# Patient Record
Sex: Male | Born: 1975 | Race: Asian | Hispanic: No | Marital: Married | Smoking: Never smoker
Health system: Southern US, Community
[De-identification: ages and names within clinical notes are randomized; demographics above are authoritative.]

## PROBLEM LIST (undated history)

## (undated) DIAGNOSIS — K219 Gastro-esophageal reflux disease without esophagitis: Secondary | ICD-10-CM

## (undated) HISTORY — PX: TONSILLECTOMY: SUR1361

## (undated) HISTORY — PX: APPENDECTOMY: SHX54

---

## 2003-11-19 ENCOUNTER — Emergency Department (HOSPITAL_COMMUNITY): Admission: EM | Admit: 2003-11-19 | Discharge: 2003-11-19 | Payer: Self-pay | Admitting: Emergency Medicine

## 2003-12-10 ENCOUNTER — Emergency Department (HOSPITAL_COMMUNITY): Admission: EM | Admit: 2003-12-10 | Discharge: 2003-12-10 | Payer: Self-pay | Admitting: Emergency Medicine

## 2011-01-23 ENCOUNTER — Emergency Department (HOSPITAL_COMMUNITY)
Admission: EM | Admit: 2011-01-23 | Discharge: 2011-01-23 | Payer: Self-pay | Source: Home / Self Care | Admitting: Emergency Medicine

## 2011-01-23 LAB — HEPATIC FUNCTION PANEL
ALT: 36 U/L (ref 0–53)
AST: 27 U/L (ref 0–37)
Albumin: 4.1 g/dL (ref 3.5–5.2)
Alkaline Phosphatase: 52 U/L (ref 39–117)
Bilirubin, Direct: <0.1 mg/dL (ref 0.0–0.3)
Total Bilirubin: 0.5 mg/dL (ref 0.3–1.2)
Total Protein: 7.5 g/dL (ref 6.0–8.3)

## 2011-01-23 LAB — CBC
HCT: 43.2 % (ref 39.0–52.0)
Hemoglobin: 14.6 g/dL (ref 13.0–17.0)
MCH: 30.4 pg (ref 26.0–34.0)
MCHC: 33.8 g/dL (ref 30.0–36.0)
MCV: 90 fL (ref 78.0–100.0)
Platelets: 334 10*3/uL (ref 150–400)
RBC: 4.8 MIL/uL (ref 4.22–5.81)
RDW: 12.9 % (ref 11.5–15.5)
WBC: 9.1 10*3/uL (ref 4.0–10.5)

## 2011-01-23 LAB — URINALYSIS, ROUTINE W REFLEX MICROSCOPIC
Bilirubin Urine: NEGATIVE
Hgb urine dipstick: NEGATIVE
Ketones, ur: NEGATIVE mg/dL
Nitrite: NEGATIVE
Protein, ur: NEGATIVE mg/dL
Specific Gravity, Urine: 1.044 — ABNORMAL HIGH (ref 1.005–1.030)
Urine Glucose, Fasting: NEGATIVE mg/dL
Urobilinogen, UA: 0.2 mg/dL (ref 0.0–1.0)
pH: 5.5 (ref 5.0–8.0)

## 2011-01-23 LAB — DIFFERENTIAL
Basophils Absolute: 0 10*3/uL (ref 0.0–0.1)
Basophils Relative: 0 % (ref 0–1)
Eosinophils Absolute: 0.5 10*3/uL (ref 0.0–0.7)
Eosinophils Relative: 5 % (ref 0–5)
Lymphocytes Relative: 26 % (ref 12–46)
Lymphs Abs: 2.4 10*3/uL (ref 0.7–4.0)
Monocytes Absolute: 1 10*3/uL (ref 0.1–1.0)
Monocytes Relative: 11 % (ref 3–12)
Neutro Abs: 5.2 10*3/uL (ref 1.7–7.7)
Neutrophils Relative %: 57 % (ref 43–77)

## 2011-01-23 LAB — BASIC METABOLIC PANEL
BUN: 15 mg/dL (ref 6–23)
CO2: 26 mEq/L (ref 19–32)
Calcium: 9.3 mg/dL (ref 8.4–10.5)
Chloride: 105 mEq/L (ref 96–112)
Creatinine, Ser: 0.94 mg/dL (ref 0.4–1.5)
GFR calc Af Amer: >60 mL/min (ref >60)
GFR calc non Af Amer: >60 mL/min (ref >60)
Glucose, Bld: 134 mg/dL — ABNORMAL HIGH (ref 70–99)
Potassium: 3.7 mEq/L (ref 3.5–5.1)
Sodium: 138 mEq/L (ref 135–145)

## 2011-01-23 LAB — LIPASE, BLOOD: Lipase: 19 U/L (ref 11–59)

## 2011-08-27 ENCOUNTER — Inpatient Hospital Stay (INDEPENDENT_AMBULATORY_CARE_PROVIDER_SITE_OTHER)
Admission: RE | Admit: 2011-08-27 | Discharge: 2011-08-27 | Disposition: A | Discharge status: Home / Self Care | Payer: 59 | Source: Ambulatory Visit | Attending: Family Medicine | Admitting: Family Medicine

## 2011-08-27 DIAGNOSIS — M549 Dorsalgia, unspecified: Secondary | ICD-10-CM

## 2013-07-24 ENCOUNTER — Emergency Department (HOSPITAL_COMMUNITY): Payer: Managed Care, Other (non HMO)

## 2013-07-24 ENCOUNTER — Emergency Department (INDEPENDENT_AMBULATORY_CARE_PROVIDER_SITE_OTHER): Payer: Managed Care, Other (non HMO)

## 2013-07-24 ENCOUNTER — Encounter (HOSPITAL_COMMUNITY): Payer: Self-pay | Admitting: Emergency Medicine

## 2013-07-24 ENCOUNTER — Emergency Department (INDEPENDENT_AMBULATORY_CARE_PROVIDER_SITE_OTHER)
Admission: EM | Admit: 2013-07-24 | Discharge: 2013-07-24 | Disposition: A | Discharge status: Home / Self Care | Payer: Managed Care, Other (non HMO) | Source: Home / Self Care

## 2013-07-24 DIAGNOSIS — S63502A Unspecified sprain of left wrist, initial encounter: Secondary | ICD-10-CM

## 2013-07-24 DIAGNOSIS — S63509A Unspecified sprain of unspecified wrist, initial encounter: Secondary | ICD-10-CM

## 2013-07-24 NOTE — ED Notes (Signed)
Reports injury to left wrist last night around 7 p.m while playing basketball.  Pt states that he went up for a rebound and fell back with left wrist extended out land on palm of hand.  Pt is c/o left wrist pain/forearm pain. Swelling and pain with ROM.

## 2013-07-24 NOTE — ED Provider Notes (Addendum)
CSN: [id number]     Arrival date & time 07/24/13  1856 History     None    Chief Complaint  Patient presents with  . Wrist Injury    injury to left wrist while playing basketball yesterday evening around 7 p.m   (Consider location/radiation/quality/duration/timing/severity/associated sxs/prior Treatment) Patient is a 37 y.o. male presenting with arm injury. The history is provided by the patient.  Arm Injury Location:  Wrist and elbow Time since incident:  1 day Injury: yes   Mechanism of injury: fall   Fall:    Fall occurred:  Recreating/playing (fell onto outstretched left arm playing bball last eve.)   Impact surface:  Athletic surface   Point of impact:  Outstretched arms   Entrapped after fall: no   Elbow location:  L elbow Wrist location:  L wrist Pain details:    Quality:  Throbbing   Severity:  Moderate   Onset quality:  Sudden   Progression:  Unchanged Dislocation: no   Foreign body present:  No foreign bodies Associated symptoms: no back pain and no numbness     History reviewed. No pertinent past medical history. Past Surgical History  Procedure Laterality Date  . Appendectomy     History reviewed. No pertinent family history. History  Substance Use Topics  . Smoking status: Never Smoker   . Smokeless tobacco: Not on file  . Alcohol Use: No    Review of Systems  Constitutional: Negative.   Musculoskeletal: Positive for joint swelling. Negative for back pain.  Skin: Negative.   Neurological: Negative for numbness.    Allergies  Review of patient's allergies indicates no known allergies.  Home Medications  No current outpatient prescriptions on file. BP 140/89  Pulse 80  Temp(Src) 98.7 F (37.1 C) (Oral)  Resp 18  SpO2 100% Physical Exam  Nursing note and vitals reviewed. Constitutional: He is oriented to person, place, and time. He appears well-developed and well-nourished.  Musculoskeletal: He exhibits tenderness.       Left elbow: He  exhibits normal range of motion, no swelling, no effusion and no deformity. Tenderness found. Radial head tenderness noted.       Left wrist: He exhibits decreased range of motion, tenderness, bony tenderness and swelling. He exhibits no deformity.       Arms: Neurological: He is alert and oriented to person, place, and time.  Skin: Skin is warm and dry.    ED Course   Procedures (including critical care time)  Labs Reviewed - No data to display Dg Elbow Complete Left  07/24/2013   *RADIOLOGY REPORT*  Clinical Data: Radial low back pain since falling yesterday.  LEFT ELBOW - COMPLETE 3+ VIEW  Comparison: None.  Findings: The mineralization and alignment are normal.  There is no evidence of acute fracture or dislocation.  The joint spaces are maintained.  There is no elbow joint effusion.  IMPRESSION: No acute osseous findings.   Original Report Authenticated By: Carey Bullocks, M.D.   Dg Wrist Complete Left  07/24/2013   *RADIOLOGY REPORT*  Clinical Data: Diffuse left wrist pain, fall 1 day ago  LEFT WRIST - COMPLETE 3+ VIEW  Comparison: None.  Findings: No fracture or dislocation.  No soft tissue abnormality. No radiopaque foreign body.  IMPRESSION: Normal exam.   Original Report Authenticated By: Christiana Pellant, M.D.   1. Sprain of wrist joint, left, initial encounter     MDM  X-rays reviewed and report per radiologist. Discussed with dr Amanda Pea , plans  as advised   Linna Hoff, MD 07/24/13 7846  Linna Hoff, MD 07/24/13 2105

## 2014-09-16 ENCOUNTER — Emergency Department (HOSPITAL_COMMUNITY)
Admission: EM | Admit: 2014-09-16 | Discharge: 2014-09-16 | Disposition: A | Discharge status: Home / Self Care | Payer: Managed Care, Other (non HMO) | Attending: Emergency Medicine | Admitting: Emergency Medicine

## 2014-09-16 ENCOUNTER — Encounter (HOSPITAL_COMMUNITY): Payer: Self-pay | Admitting: Emergency Medicine

## 2014-09-16 DIAGNOSIS — R110 Nausea: Secondary | ICD-10-CM | POA: Diagnosis not present

## 2014-09-16 DIAGNOSIS — J069 Acute upper respiratory infection, unspecified: Secondary | ICD-10-CM | POA: Diagnosis not present

## 2014-09-16 DIAGNOSIS — R21 Rash and other nonspecific skin eruption: Secondary | ICD-10-CM | POA: Diagnosis not present

## 2014-09-16 DIAGNOSIS — Z79899 Other long term (current) drug therapy: Secondary | ICD-10-CM | POA: Insufficient documentation

## 2014-09-16 DIAGNOSIS — J029 Acute pharyngitis, unspecified: Secondary | ICD-10-CM | POA: Diagnosis present

## 2014-09-16 LAB — COMPREHENSIVE METABOLIC PANEL
ALT: 19 U/L (ref 0–53)
AST: 19 U/L (ref 0–37)
Albumin: 4 g/dL (ref 3.5–5.2)
Alkaline Phosphatase: 54 U/L (ref 39–117)
Anion gap: 10 (ref 5–15)
BUN: 10 mg/dL (ref 6–23)
CO2: 30 mEq/L (ref 19–32)
Calcium: 9.2 mg/dL (ref 8.4–10.5)
Chloride: 103 mEq/L (ref 96–112)
Creatinine, Ser: 0.88 mg/dL (ref 0.50–1.35)
GFR calc Af Amer: >90 mL/min (ref >90)
GFR calc non Af Amer: >90 mL/min (ref >90)
Glucose, Bld: 101 mg/dL — ABNORMAL HIGH (ref 70–99)
Potassium: 4.3 mEq/L (ref 3.7–5.3)
Sodium: 143 mEq/L (ref 137–147)
Total Bilirubin: 0.3 mg/dL (ref 0.3–1.2)
Total Protein: 7.5 g/dL (ref 6.0–8.3)

## 2014-09-16 LAB — CBC WITH DIFFERENTIAL/PLATELET
Basophils Absolute: 0.1 10*3/uL (ref 0.0–0.1)
Basophils Relative: 1 % (ref 0–1)
Eosinophils Absolute: 0.5 10*3/uL (ref 0.0–0.7)
Eosinophils Relative: 5 % (ref 0–5)
HCT: 42.3 % (ref 39.0–52.0)
Hemoglobin: 13.6 g/dL (ref 13.0–17.0)
Lymphocytes Relative: 25 % (ref 12–46)
Lymphs Abs: 2.3 10*3/uL (ref 0.7–4.0)
MCH: 29.4 pg (ref 26.0–34.0)
MCHC: 32.2 g/dL (ref 30.0–36.0)
MCV: 91.4 fL (ref 78.0–100.0)
Monocytes Absolute: 0.6 10*3/uL (ref 0.1–1.0)
Monocytes Relative: 7 % (ref 3–12)
Neutro Abs: 5.9 10*3/uL (ref 1.7–7.7)
Neutrophils Relative %: 64 % (ref 43–77)
Platelets: 364 10*3/uL (ref 150–400)
RBC: 4.63 MIL/uL (ref 4.22–5.81)
RDW: 13.2 % (ref 11.5–15.5)
WBC: 9.3 10*3/uL (ref 4.0–10.5)

## 2014-09-16 LAB — URINALYSIS, ROUTINE W REFLEX MICROSCOPIC
Bilirubin Urine: NEGATIVE
Glucose, UA: NEGATIVE mg/dL
Hgb urine dipstick: NEGATIVE
Ketones, ur: NEGATIVE mg/dL
Leukocytes, UA: NEGATIVE
Nitrite: NEGATIVE
Protein, ur: NEGATIVE mg/dL
Specific Gravity, Urine: 1.024 (ref 1.005–1.030)
Urobilinogen, UA: 1 mg/dL (ref 0.0–1.0)
pH: 8.5 — ABNORMAL HIGH (ref 5.0–8.0)

## 2014-09-16 LAB — LIPASE, BLOOD: Lipase: 16 U/L (ref 11–59)

## 2014-09-16 MED ORDER — LORATADINE 10 MG PO TABS: 10.0000 mg | ORAL_TABLET | Freq: Every day | ORAL | Status: DC

## 2014-09-16 MED ORDER — ACETAMINOPHEN 500 MG PO TABS: 1000.0000 mg | ORAL_TABLET | Freq: Three times a day (TID) | ORAL | Status: DC | PRN

## 2014-09-16 NOTE — ED Provider Notes (Signed)
CSN: [id number]     Arrival date & time 09/16/14  4098 History   First MD Initiated Contact with Patient 09/16/14 1103     Chief Complaint  Patient presents with  . Facial Swelling  . Nausea  . Sore Throat     (Consider location/radiation/quality/duration/timing/severity/associated sxs/prior Treatment) Patient is a 37 y.o. male presenting with pharyngitis.  Sore Throat   The patient for 3 days ago he started getting a few bumps and a rash on his cheeks. 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 He is taking Benadryl for the rash. He reports however he still feels like his throat is scratchy he has a mild pressure and congestion around his forehead the nasal passages. He's been having some nasal drainage. He's had some occasional cough. He reports his eyes and neck and chest feel itchy. This morning he was nauseated he vomited twice. There is no documented fever. The patient is not expressing chills. He does not have chest pain or shortness of breath. History reviewed. No pertinent past medical history. Past Surgical History  Procedure Laterality Date  . Appendectomy     No family history on file. History  Substance Use Topics  . Smoking status: Never Smoker   . Smokeless tobacco: Not on file  . Alcohol Use: No    Review of Systems 10 Systems reviewed and are negative for acute change except as noted in the HPI.    Allergies  Shrimp  Home Medications   Prior to Admission medications   Medication Sig Start Date End Date Taking? Authorizing Provider  diphenhydrAMINE (BENADRYL) 25 mg capsule Take 25 mg by mouth every 6 (six) hours as needed for allergies.   Yes Historical Provider, MD  acetaminophen (TYLENOL) 500 MG tablet Take 2 tablets (1,000 mg total) by mouth every 8 (eight) hours as needed. 09/16/14   Arby Barrette, MD  loratadine (CLARITIN) 10 MG tablet Take 1 tablet (10 mg total) by mouth daily. One po daily x 5 days 09/16/14   Arby Barrette, MD   BP 134/86  Pulse 71  Temp(Src) 98.1 F (36.7 C) (Oral)  Resp 14  SpO2 100% Physical Exam  Constitutional: He is oriented to person, place, and time. He appears well-developed and well-nourished.  HENT:  Head: Normocephalic and atraumatic.  Right Ear: Tympanic membrane, external ear and ear canal normal.  Left Ear: Tympanic membrane, external ear and ear canal normal.  Nose: Nose normal.  Mouth/Throat: Uvula is midline, oropharynx is clear and moist and mucous membranes are normal.  Eyes: EOM are normal. Pupils are equal, round, and reactive to light.  Neck: Neck supple.  Cardiovascular: Normal rate, regular rhythm, normal heart sounds and intact distal pulses.   Pulmonary/Chest: Effort normal and breath sounds normal.  Abdominal: Soft. Bowel sounds are normal. He exhibits no distension. There is no tenderness.  Musculoskeletal: Normal range of motion. He exhibits no edema.  Neurological: He is alert and oriented to person, place, and time. He has normal strength. Coordination normal. GCS eye subscore is 4. GCS verbal subscore is 5. GCS motor subscore is 6.  Skin: Skin is warm, dry and intact.  Patient has a few papules on his cheeks and temples. These actually appear more sebaceous or acting like. The skin is free of actual urticarial reaction. These findings are really limited to that the malar and temple are regions of the face.  Psychiatric: He has a normal mood and affect.  ED Course  Procedures (including critical care time) Labs Review Labs Reviewed  COMPREHENSIVE METABOLIC PANEL - Abnormal; Notable for the following:    Glucose, Bld 101 (*)    All other components within normal limits  URINALYSIS, ROUTINE W REFLEX MICROSCOPIC - Abnormal; Notable for the following:    pH 8.5 (*)    All other components within normal limits  CBC WITH DIFFERENTIAL  LIPASE, BLOOD    Imaging Review No results found.   EKG Interpretation None      MDM   Final  diagnoses:  URI, acute   at this point the patient's general condition is very good. The physical examination does not reveal any acute findings. Symptoms are consistent with possible ongoing allergic rhinitis and ocular symptoms versus possible viral syndrome. I have discussed the patient the plan to treat him with a daily nonsedating antihistamine and Tylenol for discomfort. Conservative management for the next week and then followup.    Arby Barrette, MD 09/16/14 1126

## 2014-09-16 NOTE — ED Notes (Signed)
Bed: WA21 Expected date:  Expected time:  Means of arrival:  Comments: 

## 2014-09-16 NOTE — Progress Notes (Signed)
sees male provider [facility] [place] unable to recall the name of provider

## 2014-09-16 NOTE — ED Notes (Signed)
Pt c/o facial swelling to cheeks, sore throat, and nausea since Saturday, states he vomited this morning.

## 2014-09-16 NOTE — Discharge Instructions (Signed)
Upper Respiratory Infection, Adult °An upper respiratory infection (URI) is also sometimes known as the common cold. The upper respiratory tract includes the nose, sinuses, throat, trachea, and bronchi. Bronchi are the airways leading to the lungs. Most people improve within 1 week, but symptoms can last up to 2 weeks. A residual cough may last even longer.  °CAUSES °Many different viruses can infect the tissues lining the upper respiratory tract. The tissues become irritated and inflamed and often become very moist. Mucus production is also common. A cold is contagious. You can easily spread the virus to others by oral contact. This includes kissing, sharing a glass, coughing, or sneezing. Touching your mouth or nose and then touching a surface, which is then touched by another person, can also spread the virus. °SYMPTOMS  °Symptoms typically develop 1 to 3 days after you come in contact with a cold virus. Symptoms vary from person to person. They may include: °· Runny nose. °· Sneezing. °· Nasal congestion. °· Sinus irritation. °· Sore throat. °· Loss of voice (laryngitis). °· Cough. °· Fatigue. °· Muscle aches. °· Loss of appetite. °· Headache. °· Low-grade fever. °DIAGNOSIS  °You might diagnose your own cold based on familiar symptoms, since most people get a cold 2 to 3 times a year. Your caregiver can confirm this based on your exam. Most importantly, your caregiver can check that your symptoms are not due to another disease such as strep throat, sinusitis, pneumonia, asthma, or epiglottitis. Blood tests, throat tests, and X-rays are not necessary to diagnose a common cold, but they may sometimes be helpful in excluding other more serious diseases. Your caregiver will decide if any further tests are required. °RISKS AND COMPLICATIONS  °You may be at risk for a more severe case of the common cold if you smoke cigarettes, have chronic heart disease (such as heart failure) or lung disease (such as asthma), or if  you have a weakened immune system. The very young and very old are also at risk for more serious infections. Bacterial sinusitis, middle ear infections, and bacterial pneumonia can complicate the common cold. The common cold can worsen asthma and chronic obstructive pulmonary disease (COPD). Sometimes, these complications can require emergency medical care and may be life-threatening. °PREVENTION  °The best way to protect against getting a cold is to practice good hygiene. Avoid oral or hand contact with people with cold symptoms. Wash your hands often if contact occurs. There is no clear evidence that vitamin C, vitamin E, echinacea, or exercise reduces the chance of developing a cold. However, it is always recommended to get plenty of rest and practice good nutrition. °TREATMENT  °Treatment is directed at relieving symptoms. There is no cure. Antibiotics are not effective, because the infection is caused by a virus, not by bacteria. Treatment may include: °· Increased fluid intake. Sports drinks offer valuable electrolytes, sugars, and fluids. °· Breathing heated mist or steam (vaporizer or shower). °· Eating chicken soup or other clear broths, and maintaining good nutrition. °· Getting plenty of rest. °· Using gargles or lozenges for comfort. °· Controlling fevers with ibuprofen or acetaminophen as directed by your caregiver. °· Increasing usage of your inhaler if you have asthma. °Zinc gel and zinc lozenges, taken in the first 24 hours of the common cold, can shorten the duration and lessen the severity of symptoms. Pain medicines may help with fever, muscle aches, and throat pain. A variety of non-prescription medicines are available to treat congestion and runny nose. Your caregiver   can make recommendations and may suggest nasal or lung inhalers for other symptoms.  HOME CARE INSTRUCTIONS   Only take over-the-counter or prescription medicines for pain, discomfort, or fever as directed by your  caregiver.  Use a warm mist humidifier or inhale steam from a shower to increase air moisture. This may keep secretions moist and make it easier to breathe.  Drink enough water and fluids to keep your urine clear or pale yellow.  Rest as needed.  Return to work when your temperature has returned to normal or as your caregiver advises. You may need to stay home longer to avoid infecting others. You can also use a face mask and careful hand washing to prevent spread of the virus. SEEK MEDICAL CARE IF:   After the first few days, you feel you are getting worse rather than better.  You need your caregiver's advice about medicines to control symptoms.  You develop chills, worsening shortness of breath, or brown or red sputum. These may be signs of pneumonia.  You develop yellow or brown nasal discharge or pain in the face, especially when you bend forward. These may be signs of sinusitis.  You develop a fever, swollen neck glands, pain with swallowing, or white areas in the back of your throat. These may be signs of strep throat. SEEK IMMEDIATE MEDICAL CARE IF:   You have a fever.  You develop severe or persistent headache, ear pain, sinus pain, or chest pain.  You develop wheezing, a prolonged cough, cough up blood, or have a change in your usual mucus (if you have chronic lung disease).  You develop sore muscles or a stiff neck. Document Released: 06/08/2001 Document Revised: 03/06/2012 Document Reviewed: 03/20/2014 [facility] Patient Information 2015 [place], [place]. This information is not intended to replace advice given to you by your health care provider. Make sure you discuss any questions you have with your health care provider. Hay Fever Hay fever is an allergic reaction to particles in the air. It cannot be passed from person to person. It cannot be cured, but it can be controlled. CAUSES  Hay fever is caused by something that triggers an allergic reaction (allergens). The  following are examples of allergens:  Ragweed.  Feathers.  Animal dander.  Grass and tree pollens.  Cigarette smoke.  House dust.  Pollution. SYMPTOMS   Sneezing.  Runny or stuffy nose.  Tearing eyes.  Itchy eyes, nose, mouth, throat, skin, or other area.  Sore throat.  Headache.  Decreased sense of smell or taste. DIAGNOSIS Your caregiver will perform a physical exam and ask questions about the symptoms you are having.Allergy testing may be done to determine exactly what triggers your hay fever.  TREATMENT   Over-the-counter medicines may help symptoms. These include:  Antihistamines.  Decongestants. These may help with nasal congestion.  Your caregiver may prescribe medicines if over-the-counter medicines do not work.  Some people benefit from allergy shots when other medicines are not helpful. HOME CARE INSTRUCTIONS   Avoid the allergen that is causing your symptoms, if possible.  Take all medicine as told by your caregiver. SEEK MEDICAL CARE IF:   You have severe allergy symptoms and your current medicines are not helping.  Your treatment was working at one time, but you are now experiencing symptoms.  You have sinus congestion and pressure.  You develop a fever or headache.  You have thick nasal discharge.  You have asthma and have a worsening cough and wheezing. SEEK IMMEDIATE MEDICAL CARE IF:  You have swelling of your tongue or lips.  You have trouble breathing.  You feel lightheaded or like you are going to faint.  You have cold sweats.  You have a fever. Document Released: 12/13/2005 Document Revised: 03/06/2012 Document Reviewed: 03/10/2011 [facility] Patient Information 2015 [place], [place]. This information is not intended to replace advice given to you by your health care provider. Make sure you discuss any questions you have with your health care provider.

## 2016-12-18 ENCOUNTER — Emergency Department (HOSPITAL_COMMUNITY)
Admission: EM | Admit: 2016-12-18 | Discharge: 2016-12-18 | Disposition: A | Discharge status: Home / Self Care | Payer: Medicaid Other | Attending: Emergency Medicine | Admitting: Emergency Medicine

## 2016-12-18 ENCOUNTER — Encounter (HOSPITAL_COMMUNITY): Payer: Self-pay

## 2016-12-18 DIAGNOSIS — K4090 Unilateral inguinal hernia, without obstruction or gangrene, not specified as recurrent: Secondary | ICD-10-CM | POA: Diagnosis not present

## 2016-12-18 DIAGNOSIS — Z79899 Other long term (current) drug therapy: Secondary | ICD-10-CM | POA: Diagnosis not present

## 2016-12-18 DIAGNOSIS — R2241 Localized swelling, mass and lump, right lower limb: Secondary | ICD-10-CM | POA: Diagnosis present

## 2016-12-18 NOTE — ED Triage Notes (Addendum)
Pt c/o worsening L groin pain from hernia x 1.5 months and intermittent bright red rectal bleeding "after heavy lifting."  Pain score 7/10.  Pt reports that he has been doing "alot of heavy lifting."  Sts "I read that you need to be seen when you can't push it back in and I can't anymore."

## 2016-12-18 NOTE — ED Provider Notes (Signed)
[facility]-EMERGENCY DEPT Provider Note   CSN: [id number] Arrival date & time: 12/18/16  1153     History   Chief Complaint Chief Complaint  Patient presents with  . Hernia    HPI Andrew Park is a 40 y.o. male.  40 year old male presents with 1-2 month history of intermittent right groin swelling is worse with lifting or Valsalva maneuver. Denies any associated abdominal pain, vomiting. He's able to reduce the bulge with direct pressure. Pain characterized as sharp and is positional and worse with standing. Denies any urinary symptoms.      History reviewed. No pertinent past medical history.  There are no active problems to display for this patient.   Past Surgical History:  Procedure Laterality Date  . APPENDECTOMY         Home Medications    Prior to Admission medications   Medication Sig Start Date End Date Taking? Authorizing Provider  acetaminophen (TYLENOL) 500 MG tablet Take 2 tablets (1,000 mg total) by mouth every 8 (eight) hours as needed. 09/16/14   Arby BarretteMarcy Pfeiffer, MD  diphenhydrAMINE (BENADRYL) 25 mg capsule Take 25 mg by mouth every 6 (six) hours as needed for allergies.    Historical Provider, MD  loratadine (CLARITIN) 10 MG tablet Take 1 tablet (10 mg total) by mouth daily. One po daily x 5 days 09/16/14   Arby BarretteMarcy Pfeiffer, MD    Family History History reviewed. No pertinent family history.  Social History Social History  Substance Use Topics  . Smoking status: Never Smoker  . Smokeless tobacco: Never Used  . Alcohol use No     Allergies   Shrimp [shellfish allergy]   Review of Systems Review of Systems  All other systems reviewed and are negative.    Physical Exam Updated Vital Signs BP 133/81 (BP Location: Left Arm)   Pulse 77   Temp 98 F (36.7 C) (Oral)   Resp 18   Ht 5\' 11"  (1.803 m)   Wt 95.3 kg   SpO2 99%   BMI 29.29 kg/m   Physical Exam  Constitutional: He is oriented to person, place, and time. He appears  well-developed and well-nourished.  Non-toxic appearance. No distress.  HENT:  Head: Normocephalic and atraumatic.  Eyes: Conjunctivae, EOM and lids are normal. Pupils are equal, round, and reactive to light.  Neck: Normal range of motion. Neck supple. No tracheal deviation present. No thyroid mass present.  Cardiovascular: Normal rate, regular rhythm and normal heart sounds.  Exam reveals no gallop.   No murmur heard. Pulmonary/Chest: Effort normal and breath sounds normal. No stridor. No respiratory distress. He has no decreased breath sounds. He has no wheezes. He has no rhonchi. He has no rales.  Abdominal: Soft. Normal appearance and bowel sounds are normal. He exhibits no distension. There is no tenderness. There is no rebound and no CVA tenderness. A hernia is present. Hernia confirmed positive in the left inguinal area.  Genitourinary: Left testis shows no mass and no tenderness.  Musculoskeletal: Normal range of motion. He exhibits no edema or tenderness.  Lymphadenopathy: No inguinal adenopathy noted on the left side.  Neurological: He is alert and oriented to person, place, and time. He has normal strength. No cranial nerve deficit or sensory deficit. GCS eye subscore is 4. GCS verbal subscore is 5. GCS motor subscore is 6.  Skin: Skin is warm and dry. No abrasion and no rash noted.  Psychiatric: He has a normal mood and affect. His speech is normal and behavior  is normal.  Nursing note and vitals reviewed.    ED Treatments / Results  Labs (all labs ordered are listed, but only abnormal results are displayed) Labs Reviewed - No data to display  EKG  EKG Interpretation None       Radiology No results found.  Procedures Procedures (including critical care time)  Medications Ordered in ED Medications - No data to display   Initial Impression / Assessment and Plan / ED Course  I have reviewed the triage vital signs and the nursing notes.  Pertinent labs & imaging  results that were available during my care of the patient were reviewed by me and considered in my medical decision making (see chart for details).  Clinical Course     Patient with reducible left-sided inguinal hernia. Surgical referral given  Final Clinical Impressions(s) / ED Diagnoses   Final diagnoses:  None    New Prescriptions New Prescriptions   No medications on file     Lorre NickAnthony Jamell Opfer, MD 12/18/16 1257

## 2017-01-19 ENCOUNTER — Ambulatory Visit: Payer: Self-pay | Admitting: General Surgery

## 2017-01-20 ENCOUNTER — Encounter (HOSPITAL_COMMUNITY)
Admission: RE | Admit: 2017-01-20 | Discharge: 2017-01-20 | Disposition: A | Discharge status: Home / Self Care | Payer: Medicaid Other | Source: Ambulatory Visit | Attending: General Surgery | Admitting: General Surgery

## 2017-01-20 ENCOUNTER — Encounter (HOSPITAL_COMMUNITY): Payer: Self-pay

## 2017-01-20 DIAGNOSIS — K4090 Unilateral inguinal hernia, without obstruction or gangrene, not specified as recurrent: Secondary | ICD-10-CM | POA: Diagnosis not present

## 2017-01-20 DIAGNOSIS — Z01812 Encounter for preprocedural laboratory examination: Secondary | ICD-10-CM | POA: Diagnosis present

## 2017-01-20 HISTORY — DX: Gastro-esophageal reflux disease without esophagitis: K21.9

## 2017-01-20 LAB — CBC
HEMATOCRIT: 45.1 % (ref 39.0–52.0)
Hemoglobin: 14.9 g/dL (ref 13.0–17.0)
MCH: 29.5 pg (ref 26.0–34.0)
MCHC: 33 g/dL (ref 30.0–36.0)
MCV: 89.3 fL (ref 78.0–100.0)
Platelets: 318 10*3/uL (ref 150–400)
RBC: 5.05 MIL/uL (ref 4.22–5.81)
RDW: 13.2 % (ref 11.5–15.5)
WBC: 10.2 10*3/uL (ref 4.0–10.5)

## 2017-01-20 NOTE — Pre-Procedure Instructions (Addendum)
    Andrew Park  01/20/2017      [facility] - [place] - [address] [address] [address] [address] [place] [address] Phone: [phone] Fax: [phone]  [facility] [place] - [address] [place] [address] Phone: [phone] Fax: [phone]    Your procedure is scheduled on Friday, January 21, 2017  [facility] Admitting at 5:30 A.M.  Call this number if you have problems the morning of surgery:  [phone]   Remember:  Do not eat food or drink liquids after midnight.  Take these medicines the morning of surgery with A SIP OF WATER : None Stop taking Aspirin, vitamins, fish oil and herbal medications. Do not take any NSAIDs ie: Ibuprofen, Advil, Naproxen, BC and Goody Powder or any medication containing Aspirin; stop now.  Do not wear jewelry, make-up or nail polish.  Do not wear lotions, powders, or perfumes, or deoderant.  Do not shave 48 hours prior to surgery.  Men may shave face and neck.  Do not bring valuables to the hospital.  [facility] is not responsible for any belongings or valuables.  Contacts, dentures or bridgework may not be worn into surgery.  Leave your suitcase in the car.  After surgery it may be brought to your room. For patients admitted to the hospital, discharge time will be determined by your treatment team. Patients discharged the day of surgery will not be allowed to drive home.  Special instructions: Shower the night before surgery and the morning of surgery with CHG. Please read over the following fact sheets that you were given. Pain Booklet, Coughing and Deep Breathing and Surgical Site Infection Prevention

## 2017-01-21 ENCOUNTER — Encounter (HOSPITAL_COMMUNITY)
Admission: RE | Disposition: A | Discharge status: Home / Self Care | Payer: Self-pay | Source: Ambulatory Visit | Attending: General Surgery

## 2017-01-21 ENCOUNTER — Ambulatory Visit (HOSPITAL_COMMUNITY)
Admission: RE | Admit: 2017-01-21 | Discharge: 2017-01-21 | Disposition: A | Discharge status: Home / Self Care | Payer: Medicaid Other | Source: Ambulatory Visit | Attending: General Surgery | Admitting: General Surgery

## 2017-01-21 ENCOUNTER — Ambulatory Visit (HOSPITAL_COMMUNITY): Payer: Medicaid Other | Admitting: Certified Registered"

## 2017-01-21 ENCOUNTER — Encounter (HOSPITAL_COMMUNITY): Payer: Self-pay | Admitting: Urology

## 2017-01-21 DIAGNOSIS — Z87891 Personal history of nicotine dependence: Secondary | ICD-10-CM | POA: Diagnosis not present

## 2017-01-21 DIAGNOSIS — K4090 Unilateral inguinal hernia, without obstruction or gangrene, not specified as recurrent: Secondary | ICD-10-CM | POA: Diagnosis present

## 2017-01-21 DIAGNOSIS — G4730 Sleep apnea, unspecified: Secondary | ICD-10-CM | POA: Insufficient documentation

## 2017-01-21 DIAGNOSIS — K4030 Unilateral inguinal hernia, with obstruction, without gangrene, not specified as recurrent: Secondary | ICD-10-CM | POA: Diagnosis not present

## 2017-01-21 HISTORY — PX: INGUINAL HERNIA REPAIR: SHX194

## 2017-01-21 HISTORY — PX: INSERTION OF MESH: SHX5868

## 2017-01-21 SURGERY — REPAIR, HERNIA, INGUINAL, LAPAROSCOPIC
Anesthesia: General | Site: Groin | Laterality: Left

## 2017-01-21 MED ORDER — LIDOCAINE 2% (20 MG/ML) 5 ML SYRINGE
INTRAMUSCULAR | Status: DC | PRN
  Administered 2017-01-21: 70 mg via INTRAVENOUS

## 2017-01-21 MED ORDER — ACETAMINOPHEN 650 MG RE SUPP
650.0000 mg | RECTAL | Status: DC | PRN
  Filled 2017-01-21: qty 1

## 2017-01-21 MED ORDER — CEFAZOLIN SODIUM-DEXTROSE 2-4 GM/100ML-% IV SOLN
2.0000 g | INTRAVENOUS | Status: AC
  Administered 2017-01-21: 2 g via INTRAVENOUS
  Filled 2017-01-21: qty 100

## 2017-01-21 MED ORDER — FENTANYL CITRATE (PF) 100 MCG/2ML IJ SOLN
INTRAMUSCULAR | Status: DC | PRN
  Administered 2017-01-21 (×2): 50 ug via INTRAVENOUS
  Administered 2017-01-21: 100 ug via INTRAVENOUS

## 2017-01-21 MED ORDER — HYDROMORPHONE HCL 1 MG/ML IJ SOLN
INTRAMUSCULAR | Status: AC
  Filled 2017-01-21: qty 0.5

## 2017-01-21 MED ORDER — MORPHINE SULFATE (PF) 2 MG/ML IV SOLN: 2.0000 mg | INTRAVENOUS | Status: DC | PRN

## 2017-01-21 MED ORDER — DEXAMETHASONE SODIUM PHOSPHATE 10 MG/ML IJ SOLN
INTRAMUSCULAR | Status: AC
  Filled 2017-01-21: qty 1

## 2017-01-21 MED ORDER — FENTANYL CITRATE (PF) 100 MCG/2ML IJ SOLN
INTRAMUSCULAR | Status: AC
  Filled 2017-01-21: qty 2

## 2017-01-21 MED ORDER — ROCURONIUM BROMIDE 50 MG/5ML IV SOSY
PREFILLED_SYRINGE | INTRAVENOUS | Status: AC
  Filled 2017-01-21: qty 5

## 2017-01-21 MED ORDER — MIDAZOLAM HCL 5 MG/5ML IJ SOLN
INTRAMUSCULAR | Status: DC | PRN
  Administered 2017-01-21: 2 mg via INTRAVENOUS

## 2017-01-21 MED ORDER — SUGAMMADEX SODIUM 200 MG/2ML IV SOLN
INTRAVENOUS | Status: DC | PRN
  Administered 2017-01-21: 200 mg via INTRAVENOUS

## 2017-01-21 MED ORDER — SUGAMMADEX SODIUM 200 MG/2ML IV SOLN
INTRAVENOUS | Status: AC
  Filled 2017-01-21: qty 2

## 2017-01-21 MED ORDER — OXYCODONE HCL 5 MG PO TABS
ORAL_TABLET | ORAL | Status: AC
  Filled 2017-01-21: qty 2

## 2017-01-21 MED ORDER — CHLORHEXIDINE GLUCONATE CLOTH 2 % EX PADS: 6.0000 | MEDICATED_PAD | Freq: Once | CUTANEOUS | Status: DC

## 2017-01-21 MED ORDER — OXYCODONE-ACETAMINOPHEN 5-325 MG PO TABS: 1.0000 | ORAL_TABLET | ORAL | 0 refills | Status: DC | PRN

## 2017-01-21 MED ORDER — PROPOFOL 10 MG/ML IV BOLUS
INTRAVENOUS | Status: AC
  Filled 2017-01-21: qty 20

## 2017-01-21 MED ORDER — LACTATED RINGERS IV SOLN
INTRAVENOUS | Status: DC | PRN
  Administered 2017-01-21 (×2): via INTRAVENOUS

## 2017-01-21 MED ORDER — ONDANSETRON HCL 4 MG/2ML IJ SOLN
INTRAMUSCULAR | Status: AC
  Filled 2017-01-21: qty 2

## 2017-01-21 MED ORDER — BUPIVACAINE HCL 0.25 % IJ SOLN
INTRAMUSCULAR | Status: DC | PRN
  Administered 2017-01-21: 7 mL

## 2017-01-21 MED ORDER — MIDAZOLAM HCL 2 MG/2ML IJ SOLN
INTRAMUSCULAR | Status: AC
  Filled 2017-01-21: qty 2

## 2017-01-21 MED ORDER — OXYCODONE HCL 5 MG PO TABS: 5.0000 mg | ORAL_TABLET | ORAL | Status: DC | PRN

## 2017-01-21 MED ORDER — ONDANSETRON HCL 4 MG/2ML IJ SOLN: 4.0000 mg | Freq: Four times a day (QID) | INTRAMUSCULAR | Status: DC | PRN

## 2017-01-21 MED ORDER — BUPIVACAINE HCL (PF) 0.25 % IJ SOLN
INTRAMUSCULAR | Status: AC
  Filled 2017-01-21: qty 30

## 2017-01-21 MED ORDER — OXYCODONE HCL 5 MG/5ML PO SOLN: 5.0000 mg | Freq: Once | ORAL | Status: AC | PRN

## 2017-01-21 MED ORDER — SODIUM CHLORIDE 0.9 % IV SOLN: 250.0000 mL | INTRAVENOUS | Status: DC | PRN

## 2017-01-21 MED ORDER — 0.9 % SODIUM CHLORIDE (POUR BTL) OPTIME
TOPICAL | Status: DC | PRN
  Administered 2017-01-21: 1000 mL

## 2017-01-21 MED ORDER — ONDANSETRON HCL 4 MG/2ML IJ SOLN
INTRAMUSCULAR | Status: DC | PRN
  Administered 2017-01-21: 4 mg via INTRAVENOUS

## 2017-01-21 MED ORDER — ACETAMINOPHEN 325 MG PO TABS
650.0000 mg | ORAL_TABLET | ORAL | Status: DC | PRN
  Filled 2017-01-21: qty 2

## 2017-01-21 MED ORDER — SODIUM CHLORIDE 0.9% FLUSH: 3.0000 mL | INTRAVENOUS | Status: DC | PRN

## 2017-01-21 MED ORDER — LIDOCAINE 2% (20 MG/ML) 5 ML SYRINGE
INTRAMUSCULAR | Status: AC
  Filled 2017-01-21: qty 5

## 2017-01-21 MED ORDER — PROPOFOL 10 MG/ML IV BOLUS
INTRAVENOUS | Status: DC | PRN
  Administered 2017-01-21: 200 mg via INTRAVENOUS

## 2017-01-21 MED ORDER — SODIUM CHLORIDE 0.9% FLUSH: 3.0000 mL | Freq: Two times a day (BID) | INTRAVENOUS | Status: DC

## 2017-01-21 MED ORDER — DEXAMETHASONE SODIUM PHOSPHATE 10 MG/ML IJ SOLN
INTRAMUSCULAR | Status: DC | PRN
  Administered 2017-01-21: 6 mg via INTRAVENOUS

## 2017-01-21 MED ORDER — HYDROMORPHONE HCL 1 MG/ML IJ SOLN
0.2500 mg | INTRAMUSCULAR | Status: DC | PRN
  Administered 2017-01-21 (×3): 0.5 mg via INTRAVENOUS

## 2017-01-21 MED ORDER — OXYCODONE HCL 5 MG PO TABS
5.0000 mg | ORAL_TABLET | Freq: Once | ORAL | Status: AC | PRN
  Administered 2017-01-21: 10 mg via ORAL

## 2017-01-21 MED ORDER — SUCCINYLCHOLINE CHLORIDE 200 MG/10ML IV SOSY
PREFILLED_SYRINGE | INTRAVENOUS | Status: AC
  Filled 2017-01-21: qty 10

## 2017-01-21 MED ORDER — ROCURONIUM BROMIDE 10 MG/ML (PF) SYRINGE
PREFILLED_SYRINGE | INTRAVENOUS | Status: DC | PRN
  Administered 2017-01-21: 10 mg via INTRAVENOUS
  Administered 2017-01-21: 50 mg via INTRAVENOUS

## 2017-01-21 SURGICAL SUPPLY — 46 items
APPLIER CLIP 5 13 M/L LIGAMAX5 (MISCELLANEOUS)
BENZOIN TINCTURE PRP APPL 2/3 (GAUZE/BANDAGES/DRESSINGS) ×3 IMPLANT
CANISTER SUCTION 2500CC (MISCELLANEOUS) IMPLANT
CHLORAPREP W/TINT 26ML (MISCELLANEOUS) ×3 IMPLANT
CLIP APPLIE 5 13 M/L LIGAMAX5 (MISCELLANEOUS) IMPLANT
CLOSURE WOUND 1/2 X4 (GAUZE/BANDAGES/DRESSINGS) ×1
COVER SURGICAL LIGHT HANDLE (MISCELLANEOUS) ×3 IMPLANT
DISSECTOR BLUNT TIP ENDO 5MM (MISCELLANEOUS) IMPLANT
ELECT REM PT RETURN 9FT ADLT (ELECTROSURGICAL) ×3
ELECTRODE REM PT RTRN 9FT ADLT (ELECTROSURGICAL) ×1 IMPLANT
ENDOLOOP SUT PDS II  0 18 (SUTURE) ×4
ENDOLOOP SUT PDS II 0 18 (SUTURE) ×2 IMPLANT
GAUZE SPONGE 2X2 8PLY STRL LF (GAUZE/BANDAGES/DRESSINGS) ×1 IMPLANT
GLOVE BIO SURGEON STRL SZ7.5 (GLOVE) ×3 IMPLANT
GLOVE BIOGEL PI IND STRL 7.0 (GLOVE) ×1 IMPLANT
GLOVE BIOGEL PI INDICATOR 7.0 (GLOVE) ×2
GLOVE SURG SS PI 6.5 STRL IVOR (GLOVE) ×3 IMPLANT
GOWN STRL REUS W/ TWL LRG LVL3 (GOWN DISPOSABLE) ×3 IMPLANT
GOWN STRL REUS W/ TWL XL LVL3 (GOWN DISPOSABLE) ×1 IMPLANT
GOWN STRL REUS W/TWL LRG LVL3 (GOWN DISPOSABLE) ×6
GOWN STRL REUS W/TWL XL LVL3 (GOWN DISPOSABLE) ×2
KIT BASIN OR (CUSTOM PROCEDURE TRAY) ×3 IMPLANT
KIT ROOM TURNOVER OR (KITS) ×3 IMPLANT
MESH 3DMAX 4X6 LT LRG (Mesh General) ×3 IMPLANT
NEEDLE INSUFFLATION 14GA 120MM (NEEDLE) IMPLANT
NS IRRIG 1000ML POUR BTL (IV SOLUTION) ×3 IMPLANT
PAD ARMBOARD 7.5X6 YLW CONV (MISCELLANEOUS) ×6 IMPLANT
RELOAD STAPLE HERNIA 4.0 BLUE (INSTRUMENTS) ×3 IMPLANT
RELOAD STAPLE HERNIA 4.8 BLK (STAPLE) IMPLANT
SCISSORS LAP 5X35 DISP (ENDOMECHANICALS) ×3 IMPLANT
SET IRRIG TUBING LAPAROSCOPIC (IRRIGATION / IRRIGATOR) IMPLANT
SET TROCAR LAP APPLE-HUNT 5MM (ENDOMECHANICALS) ×3 IMPLANT
SPONGE GAUZE 2X2 STER 10/PKG (GAUZE/BANDAGES/DRESSINGS) ×2
STAPLER HERNIA 12 8.5 360D (INSTRUMENTS) ×3 IMPLANT
STRIP CLOSURE SKIN 1/2X4 (GAUZE/BANDAGES/DRESSINGS) ×2 IMPLANT
SUT MNCRL AB 4-0 PS2 18 (SUTURE) ×3 IMPLANT
SUT VIC AB 1 CT1 27 (SUTURE)
SUT VIC AB 1 CT1 27XBRD ANBCTR (SUTURE) IMPLANT
SYRINGE TOOMEY DISP (SYRINGE) IMPLANT
TOWEL OR 17X24 6PK STRL BLUE (TOWEL DISPOSABLE) ×3 IMPLANT
TOWEL OR 17X26 10 PK STRL BLUE (TOWEL DISPOSABLE) IMPLANT
TRAY FOLEY CATH SILVER 14FR (SET/KITS/TRAYS/PACK) ×3 IMPLANT
TRAY LAPAROSCOPIC MC (CUSTOM PROCEDURE TRAY) ×3 IMPLANT
TROCAR XCEL 12X100 BLDLESS (ENDOMECHANICALS) ×3 IMPLANT
TUBING INSUFFLATION (TUBING) ×3 IMPLANT
WATER STERILE IRR 1000ML POUR (IV SOLUTION) IMPLANT

## 2017-01-21 NOTE — Anesthesia Postprocedure Evaluation (Signed)
Anesthesia Post Note  Patient: Andrew Park  Procedure(s) Performed: Procedure(s) (LRB): LAPAROSCOPIC LEFT  INGUINAL HERNIA (Left) INSERTION OF MESH (Left)  Patient location during evaluation: PACU Anesthesia Type: General Level of consciousness: awake and alert and patient cooperative Pain management: pain level controlled Vital Signs Assessment: post-procedure vital signs reviewed and stable Respiratory status: spontaneous breathing and respiratory function stable Cardiovascular status: stable Anesthetic complications: no       Last Vitals:  Vitals:   01/21/17 1006 01/21/17 1014  BP: 124/85   Pulse: 84 88  Resp: 19 14  Temp:      Last Pain:  Vitals:   01/21/17 1014  TempSrc:   PainSc: 8                  [name] S

## 2017-01-21 NOTE — Transfer of Care (Signed)
Immediate Anesthesia Transfer of Care Note  Patient: Andrew Park  Procedure(s) Performed: Procedure(s): LAPAROSCOPIC LEFT  INGUINAL HERNIA (Left) INSERTION OF MESH (Left)  Patient Location: PACU  Anesthesia Type:General  Level of Consciousness: awake and alert   Airway & Oxygen Therapy: Patient Spontanous Breathing and Patient connected to nasal cannula oxygen  Post-op Assessment: Report given to RN and Post -op Vital signs reviewed and stable  Post vital signs: Reviewed and stable  Last Vitals:  Vitals:   01/21/17 0619 01/21/17 0835  BP: 132/90   Pulse: 82 92  Resp: 20 (!) 22  Temp: 36.8 C 36.2 C    Last Pain:  Vitals:   01/21/17 0619  TempSrc: Oral         Complications: No apparent anesthesia complications

## 2017-01-21 NOTE — Anesthesia Procedure Notes (Signed)
Procedure Name: Intubation Date/Time: 01/21/2017 7:25 AM Performed by: Myna Bright Pre-anesthesia Checklist: Patient identified, Emergency Drugs available, Suction available and Patient being monitored Patient Re-evaluated:Patient Re-evaluated prior to inductionOxygen Delivery Method: Circle system utilized Preoxygenation: Pre-oxygenation with 100% oxygen Intubation Type: IV induction Ventilation: Mask ventilation without difficulty Laryngoscope Size: Mac and 4 Grade View: Grade II Tube type: Oral Tube size: 7.5 mm Number of attempts: 1 Airway Equipment and Method: Stylet Placement Confirmation: ETT inserted through vocal cords under direct vision,  breath sounds checked- equal and bilateral and positive ETCO2 Secured at: 22 cm Tube secured with: Tape Dental Injury: Teeth and Oropharynx as per pre-operative assessment

## 2017-01-21 NOTE — Discharge Instructions (Signed)
[facility] _______[facility] °INGUINAL HERNIA REPAIR: POST OP INSTRUCTIONS ° °Always review your discharge instruction sheet given to you by the facility where your surgery was performed. °IF YOU HAVE DISABILITY OR FAMILY LEAVE FORMS, YOU MUST BRING THEM TO THE OFFICE FOR PROCESSING.   °DO NOT GIVE THEM TO YOUR DOCTOR. ° °1. A  prescription for pain medication may be given to you upon discharge.  Take your pain medication as prescribed, if needed.  If narcotic pain medicine is not needed, then you may take acetaminophen (Tylenol) or ibuprofen (Advil) as needed. °2. Take your usually prescribed medications unless otherwise directed. °If you need a refill on your pain medication, please contact your pharmacy.  They will contact our office to request authorization. Prescriptions will not be filled after 5 pm or on week-ends. °3. You should follow a light diet the first 24 hours after arrival home, such as soup and crackers, etc.  Be sure to include lots of fluids daily.  Resume your normal diet the day after surgery. °4.Most patients will experience some swelling and bruising around the umbilicus or in the groin and scrotum.  Ice packs and reclining will help.  Swelling and bruising can take several days to resolve.  °6. It is common to experience some constipation if taking pain medication after surgery.  Increasing fluid intake and taking a stool softener (such as Colace) will usually help or prevent this problem from occurring.  A mild laxative (Milk of Magnesia or Miralax) should be taken according to package directions if there are no bowel movements after 48 hours. °7. Unless discharge instructions indicate otherwise, you may remove your bandages 24-48 hours after surgery, and you may shower at that time.  You may have steri-strips (small skin tapes) in place directly over the incision.  These strips should be left on the skin for 7-10 days.  If your surgeon used skin glue on the incision, you may  shower in 24 hours.  The glue will flake off over the next 2-3 weeks.  Any sutures or staples will be removed at the office during your follow-up visit. °8. ACTIVITIES:  You may resume regular (light) daily activities beginning the next day--such as daily self-care, walking, climbing stairs--gradually increasing activities as tolerated.  You may have sexual intercourse when it is comfortable.  Refrain from any heavy lifting or straining until approved by your doctor. ° °a.You may drive when you are no longer taking prescription pain medication, you can comfortably wear a seatbelt, and you can safely maneuver your car and apply brakes. °b.RETURN TO WORK:   °_____________________________________________ ° °9.You should see your doctor in the office for a follow-up appointment approximately 2-3 weeks after your surgery.  Make sure that you call for this appointment within a day or two after you arrive home to insure a convenient appointment time. °10.OTHER INSTRUCTIONS: _________________________ °   _____________________________________ ° °WHEN TO CALL YOUR DOCTOR: °1. Fever over 101.0 °2. Inability to urinate °3. Nausea and/or vomiting °4. Extreme swelling or bruising °5. Continued bleeding from incision. °6. Increased pain, redness, or drainage from the incision ° °The clinic staff is available to answer your questions during regular business hours.  Please don’t hesitate to call and ask to speak to one of the nurses for clinical concerns.  If you have a medical emergency, go to the nearest emergency room or call 911.  A surgeon from [facility] is always on call at the hospital ° ° °[address], [address], [place], [place]  [address] ? ° [address] [address], [place], [place]   [address] °[phone] ? [phone] ? FAX [phone] °Web site: [email or web address] ° °

## 2017-01-21 NOTE — H&P (Signed)
History of Present Illness Patient words: hernia.  The patient is a 41 year old male who presents with an inguinal hernia. The patient is a 41 year old male who is [facility] for [facility] of a left inguinal hernia. Patient states that he feels that he is doing a lot of lifting around the week of Christmas. He states that he was supposed left inguinal area. Since that time he's had some pain in the left inguinal area. He states that he does reduce on its own when lying down however bulges back out. He's had no signs or symptoms of incarceration or strangulation.  Patient does state that he would like to attempt nonoperative treatment prior to deciding surgery.   Past Surgical HistoryAppendectomy  Tonsillectomy   Diagnostic Studies HistoryColonoscopy  never  AllergiesNo Known Drug Allergies 01/06/2017  Medication History No Current Medications Medications Reconciled  Social History Caffeine use  Coffee, Tea. Illicit drug use  Prefer to discuss with provider. Tobacco use  Former smoker.  Family HistoryFirst Degree Relatives  No pertinent family history   Other Problems  Sleep Apnea     Review of Systems  General Present- Fatigue. Not Present- Appetite Loss, Chills, Fever, Night Sweats, Weight Gain and Weight Loss. Skin Present- Dryness. Not Present- Change in Wart/Mole, Hives, Jaundice, New Lesions, Non-Healing Wounds, Rash and Ulcer. Respiratory Present- Chronic Cough and Snoring. Not Present- Bloody sputum, Difficulty Breathing and Wheezing. Breast Not Present- Breast Mass, Breast Pain, Nipple Discharge and Skin Changes. Cardiovascular Not Present- Chest Pain, Difficulty Breathing Lying Down, Leg Cramps, Palpitations, Rapid Heart Rate, Shortness of Breath and Swelling of Extremities. Gastrointestinal Present- Abdominal Pain, Bloating, Bloody Stool, Change in Bowel Habits and Rectal Pain. Not Present- Chronic diarrhea, Constipation, Difficulty  Swallowing, Excessive gas, Gets full quickly at meals, Hemorrhoids, Indigestion, Nausea and Vomiting. Male Genitourinary Present- Change in Urinary Stream and Nocturia. Not Present- Blood in Urine, Frequency, Impotence, Painful Urination, Urgency and Urine Leakage. Musculoskeletal Present- Muscle Weakness. Not Present- Back Pain, Joint Pain, Joint Stiffness, Muscle Pain and Swelling of Extremities. Neurological Not Present- Weakness. Psychiatric Present- Change in Sleep Pattern. Not Present- Anxiety, Bipolar, Depression, Fearful and Frequent crying. Endocrine Present- Excessive Hunger. Not Present- Cold Intolerance, Hair Changes, Heat Intolerance, Hot flashes and New Diabetes.  BP 132/90   Pulse 82   Temp 98.3 F (36.8 C) (Oral)   Resp 20   SpO2 98%    Physical Exam  General Mental Status-Alert. General Appearance-Consistent with stated age. Hydration-Well hydrated. Voice-Normal.  Head and Neck Head-normocephalic, atraumatic with no lesions or palpable masses. Trachea-midline.  Eye Eyeball - Bilateral-Extraocular movements intact. Sclera/Conjunctiva - Bilateral-No scleral icterus.  Chest and Lung Exam Chest and lung exam reveals -quiet, even and easy respiratory effort with no use of accessory muscles. Inspection Chest Wall - Normal. Back - normal.  Cardiovascular Cardiovascular examination reveals -normal heart sounds, regular rate and rhythm with no murmurs.  Abdomen Inspection Skin - Scar - no surgical scars. Hernias - Inguinal hernia - Left - Reducible(Small to moderate in size.). Palpation/Percussion Normal exam - Soft, Non Tender, No Rebound tenderness, No Rigidity (guarding) and No hepatosplenomegaly. Auscultation Normal exam - Bowel sounds normal.  Neurologic Neurologic evaluation reveals -alert and oriented x 3 with no impairment of recent or remote memory. Mental Status-Normal.  Musculoskeletal Normal Exam - Left-Upper Extremity  Strength Normal and Lower Extremity Strength Normal. Normal Exam - Right-Upper Extremity Strength Normal, Lower Extremity Weakness.    Assessment & Plan LEFT INGUINAL HERNIA (K40.90) Impression:  Patient is a 41 year old male with a left inguinal hernia.   1. Will proceed to the operative for laparoscopic left inguinal hernia repair with mesh 2. All risks and benefits were discussed with the patient to generally include, but not limited to: infection, bleeding, damage to surrounding structures, acute and chronic nerve pain, and recurrence. Alternatives were offered and described. All questions were answered and the patient voiced understanding of the procedure and wishes to proceed at this point with hernia repair.

## 2017-01-21 NOTE — Anesthesia Preprocedure Evaluation (Signed)
Anesthesia Evaluation  Patient identified by MRN, date of birth, ID band Patient awake    Reviewed: Allergy & Precautions, H&P , NPO status , Patient's Chart, lab work & pertinent test results  Airway Mallampati: II   Neck ROM: full    Dental   Pulmonary neg pulmonary ROS,    breath sounds clear to auscultation       Cardiovascular negative cardio ROS   Rhythm:regular Rate:Normal     Neuro/Psych    GI/Hepatic GERD  ,  Endo/Other    Renal/GU      Musculoskeletal   Abdominal   Peds  Hematology   Anesthesia Other Findings   Reproductive/Obstetrics                             Anesthesia Physical Anesthesia Plan  ASA: II  Anesthesia Plan: General   Post-op Pain Management:    Induction: Intravenous  Airway Management Planned: Oral ETT  Additional Equipment:   Intra-op Plan:   Post-operative Plan: Extubation in OR  Informed Consent: I have reviewed the patients History and Physical, chart, labs and discussed the procedure including the risks, benefits and alternatives for the proposed anesthesia with the patient or authorized representative who has indicated his/her understanding and acceptance.     Plan Discussed with: CRNA, Anesthesiologist and Surgeon  Anesthesia Plan Comments:         Anesthesia Quick Evaluation  

## 2017-01-21 NOTE — OR Nursing (Signed)
Was not able to place Foley intra-op due to significant resistance upon inserting. Dr Derrell Lollingamirez also tried to insert the catheter upon widening the urethra with a hemostat and was unsuccessful.

## 2017-01-21 NOTE — Op Note (Signed)
01/21/2017  8:25 AM  PATIENT:  Andrew Park  41 y.o. male  PRE-OPERATIVE DIAGNOSIS:  left inguinal hernia  POST-OPERATIVE DIAGNOSIS:  left incarcerated Pantaloon inguinal hernia  PROCEDURE:  Procedure(s): LAPAROSCOPIC LEFT  INGUINAL HERNIA (Left) INSERTION OF MESH (Left)  SURGEON:  Surgeon(s) and Role:    * Axel FillerArmando Debany Vantol, MD - Primary  ANESTHESIA:   local and general  EBL:  Total I/O In: 1000 [I.V.:1000] Out: 20 [Blood:5cc]  BLOOD ADMINISTERED:none  DRAINS: none   LOCAL MEDICATIONS USED:  BUPIVICAINE   SPECIMEN:  No Specimen  DISPOSITION OF SPECIMEN:  N/A  COUNTS:  YES  TOURNIQUET:  * No tourniquets in log *  DICTATION: .Dragon Dictation  Counts: reported as correct x 2  Findings:  The patient had a large left pantaloon hernia, with a large direct component.  Indications for procedure:  The patient is a 41 year old male with a left hernia for several months. Patient complained of symptomatology to his left inguinal area. The patient was taken back for elective inguinal hernia repair.  Details of the procedure: The patient was taken back to the operating room. The patient was placed in supine position with bilateral SCDs in place.  The patient was prepped and draped in the usual sterile fashion.  After appropriate anitbiotics were confirmed, a time-out was confirmed and all facts were verified.  0.25% Marcaine was used to infiltrate the umbilical area. A 11-blade was used to cut down the skin and blunt dissection was used to get the anterior fashion.  The anterior fascia was incised approximately 1 cm and the muscles were retracted laterally. Blunt dissection was then used to create a space in the preperitoneal area. At this time a 10 mm camera was then introduced into the space and advanced the pubic tubercle and a 12 mm trocar was placed over this and insufflation was started.  At this time and space was created from medial to laterally the preperitoneal space.   Cooper's ligament was initially cleaned off.  The hernia sac was identified in the direct space. Dissection of the incarcerated direct hernia was undertaken.  Once reduced the transversalis fascia retracted spontaneously.  Dissection of the spermatic cord was undertaken the vas deferens was identified and protected in all parts of the case.  There was a small tear into the hernia sac. An Endoloop was used to ligate there tear.      The transversalis fascia was then tacked to Cooper's ligament with a 4.0 mm stapler.  Once the hernia sac was taken down to approximately the umbilicus a Bard 3D Max mesh, size: Large, was  introduced into the preperitoneal space.  The mesh was brought over to cover the direct and indirect hernia spaces.  This was anchored into place and secured to Cooper's ligament with 4.620mm staples from a Coviden hernia stapler. It was anchored to the anterior abdominal wall with 4.8 mm staples. The hernia sac was seen lying posterior to the mesh. There was no staples placed laterally. The insufflation was evacuated and the peritoneum was seen posterior to the mesh. The trochars were removed. The anterior fascia was reapproximated using #1 Vicryl on a UR- 6.  Intra-abdominal air was evacuated and the Veress needle removed. The skin was reapproximated using 4-0 Monocryl subcuticular fashion the patient was awakened from general anesthesia and taken to recovery in stable condition.    PLAN OF CARE: Discharge to home after PACU  PATIENT DISPOSITION:  PACU - hemodynamically stable.   Delay start of  Pharmacological VTE agent (>24hrs) due to surgical blood loss or risk of bleeding: not applicable

## 2017-01-24 ENCOUNTER — Encounter (HOSPITAL_COMMUNITY): Payer: Self-pay | Admitting: General Surgery

## 2017-02-11 ENCOUNTER — Ambulatory Visit (HOSPITAL_BASED_OUTPATIENT_CLINIC_OR_DEPARTMENT_OTHER)
Admission: RE | Admit: 2017-02-11 | Discharge: 2017-02-11 | Disposition: A | Discharge status: Home / Self Care | Payer: Medicaid Other | Source: Ambulatory Visit | Attending: General Surgery | Admitting: General Surgery

## 2017-02-11 ENCOUNTER — Ambulatory Visit (HOSPITAL_BASED_OUTPATIENT_CLINIC_OR_DEPARTMENT_OTHER): Admission: RE | Admit: 2017-02-11 | Payer: Medicaid Other | Source: Ambulatory Visit

## 2017-02-11 ENCOUNTER — Other Ambulatory Visit (HOSPITAL_COMMUNITY): Payer: Self-pay | Admitting: General Surgery

## 2017-02-11 DIAGNOSIS — N5082 Scrotal pain: Secondary | ICD-10-CM

## 2017-02-11 DIAGNOSIS — N503 Cyst of epididymis: Secondary | ICD-10-CM | POA: Insufficient documentation

## 2017-02-11 DIAGNOSIS — N433 Hydrocele, unspecified: Secondary | ICD-10-CM | POA: Diagnosis not present

## 2017-02-22 ENCOUNTER — Encounter (HOSPITAL_COMMUNITY): Payer: Self-pay | Admitting: Emergency Medicine

## 2017-02-22 ENCOUNTER — Emergency Department (HOSPITAL_COMMUNITY)
Admission: EM | Admit: 2017-02-22 | Discharge: 2017-02-22 | Disposition: A | Discharge status: Home / Self Care | Payer: Medicaid Other | Attending: Emergency Medicine | Admitting: Emergency Medicine

## 2017-02-22 DIAGNOSIS — K625 Hemorrhage of anus and rectum: Secondary | ICD-10-CM | POA: Diagnosis not present

## 2017-02-22 LAB — CBC
HCT: 41.7 % (ref 39.0–52.0)
Hemoglobin: 13.8 g/dL (ref 13.0–17.0)
MCH: 29.2 pg (ref 26.0–34.0)
MCHC: 33.1 g/dL (ref 30.0–36.0)
MCV: 88.3 fL (ref 78.0–100.0)
Platelets: 308 10*3/uL (ref 150–400)
RBC: 4.72 MIL/uL (ref 4.22–5.81)
RDW: 13.1 % (ref 11.5–15.5)
WBC: 8.9 10*3/uL (ref 4.0–10.5)

## 2017-02-22 LAB — COMPREHENSIVE METABOLIC PANEL
ALT: 30 U/L (ref 17–63)
AST: 20 U/L (ref 15–41)
Albumin: 4.4 g/dL (ref 3.5–5.0)
Alkaline Phosphatase: 60 U/L (ref 38–126)
Anion gap: 7 (ref 5–15)
BUN: 16 mg/dL (ref 6–20)
CALCIUM: 8.8 mg/dL — AB (ref 8.9–10.3)
CHLORIDE: 103 mmol/L (ref 101–111)
CO2: 25 mmol/L (ref 22–32)
CREATININE: 1.17 mg/dL (ref 0.61–1.24)
Glucose, Bld: 301 mg/dL — ABNORMAL HIGH (ref 65–99)
Potassium: 4 mmol/L (ref 3.5–5.1)
Sodium: 135 mmol/L (ref 135–145)
Total Bilirubin: 0.6 mg/dL (ref 0.3–1.2)
Total Protein: 7.7 g/dL (ref 6.5–8.1)

## 2017-02-22 LAB — POC OCCULT BLOOD, ED: FECAL OCCULT BLD: NEGATIVE

## 2017-02-22 LAB — TYPE AND SCREEN
ABO/RH(D): O POS
Antibody Screen: NEGATIVE

## 2017-02-22 LAB — ABO/RH: ABO/RH(D): O POS

## 2017-02-22 NOTE — ED Provider Notes (Signed)
[facility] DEPT Provider Note   CSN: [id number] Arrival date & time: 02/22/17  1510     History   Chief Complaint Chief Complaint  Patient presents with  . Rectal Bleeding    HPI Andrew Park is a 41 y.o. male.  The history is provided by the patient and medical records. No language interpreter was used.  Rectal Bleeding  Associated symptoms: no abdominal Park, no fever and no vomiting    Andrew Park is a 41 y.o. male  with a PMH of GERD who presents to the Emergency Department complaining of intermittent bright red rectal bleeding over the last year which acutely worsened today. Patient states that over the last year, he will have bright red blood in BM after heavy lifting. Today, he had rectal bleeding that was not associated with a bowel movement. He states that he had blood dripping from rectal that "made the whole toilet bowl red". No fevers or chills. No constipation/diarrhea.   Note, patient recently had inguinal hernia repair by general surgery, Dr. Derrell Lolling on 1/26. He states incision sites are healing well and he has had no complications from surgery.    Past Medical History:  Diagnosis Date  . GERD (gastroesophageal reflux disease)    occ    There are no active problems to display for this patient.   Past Surgical History:  Procedure Laterality Date  . APPENDECTOMY    . INGUINAL HERNIA REPAIR Left 01/21/2017   Procedure: LAPAROSCOPIC LEFT  INGUINAL HERNIA;  Surgeon: Axel Filler, MD;  Location: [facility] OR;  Service: General;  Laterality: Left;  . INSERTION OF MESH Left 01/21/2017   Procedure: INSERTION OF MESH;  Surgeon: Axel Filler, MD;  [facility] OR;  Service: General;  Laterality: Left;  . TONSILLECTOMY         Home Medications    Prior to Admission medications   Medication Sig Start Date End Date Taking? Authorizing Provider  ibuprofen (ADVIL,MOTRIN) 200 MG tablet Take 200 mg by mouth every 6 (six) hours as needed for moderate Park.   Yes  Historical Provider, MD  traMADol (ULTRAM) 50 MG tablet Take 50 mg by mouth every 6 (six) hours as needed for moderate Park or severe Park.   Yes Historical Provider, MD  acetaminophen (TYLENOL) 500 MG tablet Take 2 tablets (1,000 mg total) by mouth every 8 (eight) hours as needed. Patient not taking: Reported on 01/19/2017 09/16/14   Arby Barrette, MD  loratadine (CLARITIN) 10 MG tablet Take 1 tablet (10 mg total) by mouth daily. One po daily x 5 days Patient not taking: Reported on 01/19/2017 09/16/14   Arby Barrette, MD  oxyCODONE-acetaminophen (ROXICET) 5-325 MG tablet Take 1-2 tablets by mouth every 4 (four) hours as needed. Patient not taking: Reported on 02/22/2017 01/21/17   Axel Filler, MD    Family History No family history on file.  Social History Social History  Substance Use Topics  . Smoking status: Never Smoker  . Smokeless tobacco: Never Used  . Alcohol use No     Allergies   Shrimp [shellfish allergy]   Review of Systems Review of Systems  Constitutional: Negative for chills and fever.  HENT: Negative for congestion.   Eyes: Negative for visual disturbance.  Respiratory: Negative for cough and shortness of breath.   Cardiovascular: Negative for chest Park.  Gastrointestinal: Positive for hematochezia. Negative for abdominal Park, constipation, diarrhea, nausea and vomiting.  Genitourinary: Negative for dysuria.  Musculoskeletal: Negative for back Park and neck Park.  Skin: Negative for color change.  Neurological: Negative for headaches.     Physical Exam Updated Vital Signs BP 130/76 (BP Location: Right Arm)   Pulse 74   Temp 98.3 F (36.8 C) (Oral)   Resp 18   Ht 5\' 10"  (1.778 m)   Wt 98.4 kg   SpO2 97%   BMI 31.14 kg/m   Physical Exam  Constitutional: He is oriented to person, place, and time. He appears well-developed and well-nourished. No distress.  HENT:  Head: Normocephalic and atraumatic.  Cardiovascular: Normal rate, regular rhythm  and normal heart sounds.   No murmur heard. Pulmonary/Chest: Effort normal and breath sounds normal. No respiratory distress.  Abdominal: Soft. Bowel sounds are normal. He exhibits no distension.  Well healed surgical incisions with no surrounding erythema or warmth. No abdominal tenderness.   Genitourinary: Rectal exam shows no external hemorrhoid, no fissure and guaiac negative stool.  Neurological: He is alert and oriented to person, place, and time.  Skin: Skin is warm and dry.  Nursing note and vitals reviewed.    ED Treatments / Results  Labs (all labs ordered are listed, but only abnormal results are displayed) Labs Reviewed  COMPREHENSIVE METABOLIC PANEL - Abnormal; Notable for the following:       Result Value   Glucose, Bld 301 (*)    Calcium 8.8 (*)    All other components within normal limits  CBC  POC OCCULT BLOOD, ED  TYPE AND SCREEN  ABO/RH    EKG  EKG Interpretation None       Radiology No results found.  Procedures Procedures (including critical care time)  Medications Ordered in ED Medications - No data to display   Initial Impression / Assessment and Plan / ED Course  I have reviewed the triage vital signs and the nursing notes.  Pertinent labs & imaging results that were available during my care of the patient were reviewed by me and considered in my medical decision making (see chart for details).    Andrew Park is a 41 y.o. male who presents to ED for rectal bleeding intermittently over the last year which have acutely worsened today. Afebrile, well appearing and hemodynamically stable. No abdominal tenderness. Hemoccult negative. Evaluation does not show pathology that would require ongoing emergent intervention or inpatient treatment. Patient is hemodynamically stable and mentating appropriately. Will have patient follow up with GI for ongoing symptoms. Reasons to return to ER were discussed at length with patient and significant other at  bedside. Patient appears reliable for GI follow up and was discharged home in satisfactory condition.   Patient discussed with Dr. Preston FleetingGlick who agrees with treatment plan.   Final Clinical Impressions(s) / ED Diagnoses   Final diagnoses:  Rectal bleeding    New Prescriptions Discharge Medication List as of 02/22/2017  8:03 PM       [facility] Heart[facility]ime Pilcher Camille Dragan, PA-C 02/22/17 2033    Dione Boozeavid Glick, MD 02/23/17 [phone]

## 2017-02-22 NOTE — Discharge Instructions (Signed)
Call the GI clinic listed tomorrow morning to schedule a follow up appointment.  Return to ER for continual bleeding, feeling like you may pass out, new or worsening symptoms, any additional concerns.

## 2017-02-22 NOTE — ED Triage Notes (Signed)
Patient c/o rectal bleeding that has been intermittent over year. Patient states that yesterday he had lots of bleeding with stools and when wiping. Patient reports BMs were normal and didn't have to strain.  Patient has hernia surgery 4 weeks ago.

## 2017-02-23 ENCOUNTER — Telehealth: Payer: Self-pay | Admitting: Gastroenterology

## 2017-02-23 NOTE — Telephone Encounter (Signed)
Left message for patient/wife to return my call. ?

## 2017-02-23 NOTE — Telephone Encounter (Signed)
Yes, thanks

## 2017-02-23 NOTE — Telephone Encounter (Signed)
Ok,  I want to review records from [facility] prior to accepting patient to our practice. Thanks

## 2017-02-23 NOTE — Telephone Encounter (Signed)
Left message for patient's wife to return my call

## 2017-02-23 NOTE — Telephone Encounter (Signed)
patient wife states that patient was dismissed from [facility] a couple of years ago because he [place] and [place] not [place] to pay [facility]. Patient still has medicaid, [facility]. Our office is not on unassigned for  this month for [facility]. Dr. Lavon PaganiniNandigam is Doc of the Day for 02/28/18am.   Dr. Lavon PaganiniNandigam, will you accept this patient? Thanks.

## 2017-02-23 NOTE — Telephone Encounter (Signed)
Patient wife will have records faxed to our office.

## 2017-02-23 NOTE — Telephone Encounter (Signed)
Correction---  Patient wife states that patient only saw [facility] but was still discharged from practice. Do you still need those records? Thanks

## 2020-01-02 ENCOUNTER — Encounter (HOSPITAL_COMMUNITY): Payer: Self-pay

## 2020-01-02 ENCOUNTER — Other Ambulatory Visit: Payer: Self-pay

## 2020-01-02 ENCOUNTER — Emergency Department (HOSPITAL_COMMUNITY): Payer: Medicaid Other

## 2020-01-02 ENCOUNTER — Emergency Department (HOSPITAL_COMMUNITY)
Admission: EM | Admit: 2020-01-02 | Discharge: 2020-01-02 | Disposition: A | Discharge status: Home / Self Care | Payer: Medicaid Other | Attending: Emergency Medicine | Admitting: Emergency Medicine

## 2020-01-02 DIAGNOSIS — R1310 Dysphagia, unspecified: Secondary | ICD-10-CM

## 2020-01-02 DIAGNOSIS — R0989 Other specified symptoms and signs involving the circulatory and respiratory systems: Secondary | ICD-10-CM | POA: Diagnosis present

## 2020-01-02 MED ORDER — IBUPROFEN 600 MG PO TABS: 600.0000 mg | ORAL_TABLET | Freq: Four times a day (QID) | ORAL | 0 refills | Status: DC | PRN

## 2020-01-02 MED ORDER — ALUM & MAG HYDROXIDE-SIMETH 200-200-20 MG/5ML PO SUSP
30.0000 mL | Freq: Once | ORAL | Status: AC
  Administered 2020-01-02: 04:00:00 30 mL via ORAL
  Filled 2020-01-02: qty 30

## 2020-01-02 MED ORDER — LIDOCAINE VISCOUS HCL 2 % MT SOLN
15.0000 mL | Freq: Once | OROMUCOSAL | Status: AC
  Administered 2020-01-02: 04:00:00 15 mL via ORAL
  Filled 2020-01-02: qty 15

## 2020-01-02 MED ORDER — CHLORASEPTIC 6-10 MG MT LOZG: 1.0000 | LOZENGE | OROMUCOSAL | 0 refills | Status: DC | PRN

## 2020-01-02 NOTE — Discharge Instructions (Addendum)
Your work-up in the ED today was reassuring and did not reveal a large foreign body in your throat.  If you continue to have ongoing symptoms, follow-up with ENT.  Use ibuprofen for management of discomfort.  You have also been given a prescription for Cepacol lozenges.  Return for any new or concerning symptoms.

## 2020-01-02 NOTE — ED Provider Notes (Signed)
[facility] DEPT Provider Note   CSN: [id number] Arrival date & time: 01/02/20  0147     History No chief complaint on file.   KELLIS MCADAM is a 44 y.o. male.   44 year old male with a history of reflux presents to the emergency department concerned that he swallowed a piece of plastic.  He was drinking bubble tea when he feels that he ingested a sharp piece of plastic through the straw.  He was able to remove 1 piece of plastic, but feels that one remains lodged in his throat.  It is aggravated with swallowing.  Notes a sharp pain.  Has tried eating bread as well as a marshmallow to force the object into his stomach.  This has been unsuccessful.  No additional medications taken prior to arrival.  No inability to swallow or SOB.  The history is provided by the patient. No language interpreter was used.       Past Medical History:  Diagnosis Date  . GERD (gastroesophageal reflux disease)    occ    There are no problems to display for this patient.   Past Surgical History:  Procedure Laterality Date  . APPENDECTOMY    . INGUINAL HERNIA REPAIR Left 01/21/2017   Procedure: LAPAROSCOPIC LEFT  INGUINAL HERNIA;  Surgeon: Ralene Ok, MD;  Location: [facility];  Service: General;  Laterality: Left;  . INSERTION OF MESH Left 01/21/2017   Procedure: INSERTION OF MESH;  Surgeon: Ralene Ok, MD;  Location: [facility];  Service: General;  Laterality: Left;  . TONSILLECTOMY         History reviewed. No pertinent family history.  Social History   Tobacco Use  . Smoking status: Never Smoker  . Smokeless tobacco: Never Used  Substance Use Topics  . Alcohol use: No  . Drug use: No    Home Medications Prior to Admission medications   Medication Sig Start Date End Date Taking? Authorizing Provider  acetaminophen (TYLENOL) 500 MG tablet Take 2 tablets (1,000 mg total) by mouth every 8 (eight) hours as needed. Patient not taking: Reported on 01/19/2017  09/16/14   Charlesetta Shanks, MD  benzocaine-menthol (CHLORASEPTIC) 6-10 MG lozenge Take 1 lozenge by mouth as needed for sore throat. 01/02/20   Antonietta Breach, PA-C  ibuprofen (ADVIL) 600 MG tablet Take 1 tablet (600 mg total) by mouth every 6 (six) hours as needed for mild pain or moderate pain. 01/02/20   Antonietta Breach, PA-C  loratadine (CLARITIN) 10 MG tablet Take 1 tablet (10 mg total) by mouth daily. One po daily x 5 days Patient not taking: Reported on 01/19/2017 09/16/14   Charlesetta Shanks, MD  oxyCODONE-acetaminophen (ROXICET) 5-325 MG tablet Take 1-2 tablets by mouth every 4 (four) hours as needed. Patient not taking: Reported on 02/22/2017 01/21/17   Ralene Ok, MD    Allergies    Shrimp [shellfish allergy]  Review of Systems   Review of Systems  Ten systems reviewed and are negative for acute change, except as noted in the HPI.    Physical Exam Updated Vital Signs BP (!) 133/97 (BP Location: Left Arm)   Pulse 81   Temp 98.8 F (37.1 C) (Oral)   Resp 16   Ht 5\' 10"  (1.778 m)   Wt 91.4 kg   SpO2 97%   BMI 28.93 kg/m   Physical Exam Vitals and nursing note reviewed.  Constitutional:      General: He is not in acute distress.    Appearance: He  is well-developed. He is not diaphoretic.     Comments: Nontoxic appearing and in NAD  HENT:     Head: Normocephalic and atraumatic.     Mouth/Throat:     Comments: Normal phonation. Oropharynx clear without visible FB to the level of the epiglottis. Tongue protrusion is normal. No evidence of trauma to the posterior oropharynx. Tolerating secretions with discomfort. Eyes:     General: No scleral icterus.    Conjunctiva/sclera: Conjunctivae normal.  Pulmonary:     Effort: Pulmonary effort is normal. No respiratory distress.     Comments: Respirations even and unlabored. No stridor. Musculoskeletal:        General: Normal range of motion.     Cervical back: Normal range of motion.  Skin:    General: Skin is warm and dry.      Coloration: Skin is not pale.     Findings: No erythema or rash.  Neurological:     General: No focal deficit present.     Mental Status: He is alert and oriented to person, place, and time.     Coordination: Coordination normal.  Psychiatric:        Behavior: Behavior normal.     ED Results / Procedures / Treatments   Labs (all labs ordered are listed, but only abnormal results are displayed) Labs Reviewed - No data to display  EKG None  Radiology DG Neck Soft Tissue  Result Date: 01/02/2020 CLINICAL DATA:  44 year old male swallowed a piece of straw. EXAM: NECK SOFT TISSUES - 1+ VIEW COMPARISON:  None. FINDINGS: There is no evidence of retropharyngeal soft tissue swelling or epiglottic enlargement. The cervical airway is unremarkable and no radio-opaque foreign body identified. IMPRESSION: Negative. Electronically Signed   By: Elgie Collard M.D.   On: 01/02/2020 03:16   DG Chest 2 View  Result Date: 01/02/2020 CLINICAL DATA:  Odynophagia, swollen pieces of straw while drinking smoothie EXAM: CHEST - 2 VIEW COMPARISON:  Radiograph 01/23/2011 FINDINGS: Few calcified granulomata in the lung bases, similar to prior. No consolidation, features of edema, pneumothorax, or effusion. Pulmonary vascularity is normally distributed. The cardiomediastinal contours are unremarkable. Tracheal air column is patent. No radiopaque foreign body is seen. No acute osseous or soft tissue abnormality. IMPRESSION: 1. No active cardiopulmonary disease. 2. No radiopaque foreign body is seen. Please note both plastic and paper may be radiolucent precluding detection on radiography. 3. Stable calcified granulomata in the lung bases. Electronically Signed   By: Kreg Shropshire M.D.   On: 01/02/2020 03:18   CT Soft Tissue Neck Wo Contrast  Result Date: 01/02/2020 CLINICAL DATA:  Possible plastic foreign bodies in throat, pain worse on the left. EXAM: CT NECK WITHOUT CONTRAST TECHNIQUE: Multidetector CT imaging of the  neck was performed following the standard protocol without intravenous contrast. COMPARISON:  Radiography from earlier today FINDINGS: Pharynx and larynx: Unfortunate motion at the level of the larynx. Along the posterior wall of the hypopharynx are 3 blurry areas of superficial high-density which on especially reformats is best attributed to artifact. Would not expect foreign body to lodged in this location or plastic to be this dense. Submucosally high-density is from ossified thyroid cartilage. There is no superimposed swelling or airway narrowing. Salivary glands: No inflammation, mass, or stone. Thyroid: Normal. Lymph nodes: None enlarged or abnormal density. Vascular: Negative Limited intracranial: Negative Visualized orbits: Not covered Mastoids and visualized paranasal sinuses: Clear Skeleton: Unremarkable Upper chest: Negative IMPRESSION: Unfortunate motion at the level of the throat. A few  high-density foci along the posterior wall of the hypopharynx are best attributed to motion, fiberoptic visualization could visualize if persistent symptoms. There is no associated airway swelling or narrowing. Electronically Signed   By: Marnee Spring M.D.   On: 01/02/2020 04:17    Procedures Procedures (including critical care time)  Medications Ordered in ED Medications  alum & mag hydroxide-simeth (MAALOX/MYLANTA) 200-200-20 MG/5ML suspension 30 mL (30 mLs Oral Given 01/02/20 0343)    And  lidocaine (XYLOCAINE) 2 % viscous mouth solution 15 mL (15 mLs Oral Given 01/02/20 0343)    ED Course  I have reviewed the triage vital signs and the nursing notes.  Pertinent labs & imaging results that were available during my care of the patient were reviewed by me and considered in my medical decision making (see chart for details).   5:47 AM Symptoms have improved with a GI cocktail.  Patient continues to tolerate secretions without difficulty.  CT does not show any evidence of large foreign body retained in  the throat.  Few high density foci seen on imaging favored to represent motion artifact.  Plan for discharge with a ENT referral.     MDM Rules/Calculators/A&P                       44 year old male presents to the emergency department for complaints of odynophagia after he reports swallowing a piece of plastic in his bubble tea.  He is unable to explain to me how plastic got into his drink.  Reports sharp pain with swallowing.  Does not have any inability to swallow.  No foreign body visualized on exam.  He has normal phonation.  No evidence of trauma to the posterior oropharynx.  No significant foreign body noted on both x-ray or CT.  He has had symptomatic improvement following a GI cocktail.  Feel he is stable for outpatient follow-up should symptoms persist.  Will continue with supportive management including NSAIDs and Cepacol lozenges.  Return precautions discussed and provided.  Patient discharged in stable condition with no unaddressed concerns.   Final Clinical Impression(s) / ED Diagnoses Final diagnoses:  Odynophagia    Rx / DC Orders ED Discharge Orders         Ordered    benzocaine-menthol (CHLORASEPTIC) 6-10 MG lozenge  As needed     01/02/20 0550    ibuprofen (ADVIL) 600 MG tablet  Every 6 hours PRN     01/02/20 0550           Antony Madura, PA-C 01/02/20 0601    Zadie Rhine, MD 01/02/20 [phone]

## 2020-01-02 NOTE — ED Triage Notes (Signed)
Pt was drinking a smoothie and states that he swallowed pieces of plastic from the straw, he says it feels like he still has some plastic in his throat and it's sore

## 2021-07-14 IMAGING — CT CT NECK W/O CM
5 series · 16 of 33 positions shown, 18 images · non-contrast
Comparison: Radiography from earlier today

CLINICAL DATA: Possible plastic foreign bodies in throat, pain
worse on the left.

EXAM:
CT NECK WITHOUT CONTRAST
TECHNIQUE: Multidetector CT imaging of the neck was performed following the
standard protocol without intravenous contrast.

[Series 2: axial neck · axial · 0.52mm/px · z∈[-183,-103]mm · 2 of 121 slices shown]
[im 41/121  bone]
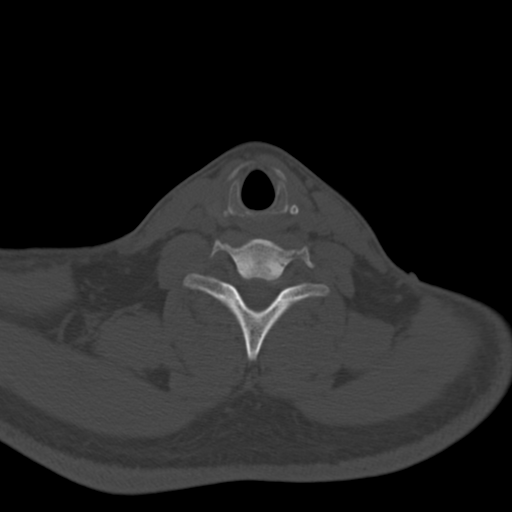
[im 81/121  bone]
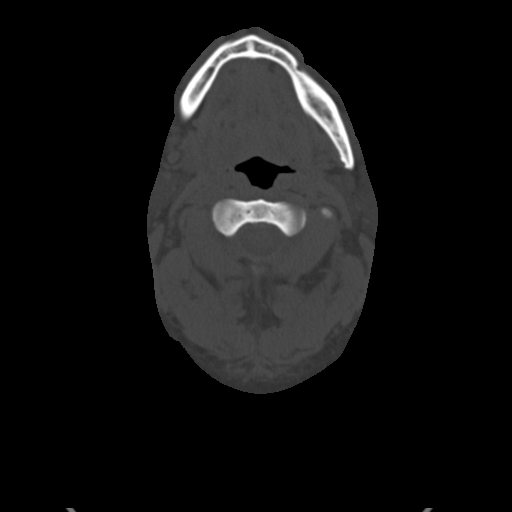

[Series 4: orthogonal ax · axial · 0.35mm/px · z∈[-217,-97]mm · 3 of 123 slices shown, 4 images (1 of 2)]
[im 31/123  soft-tissue]
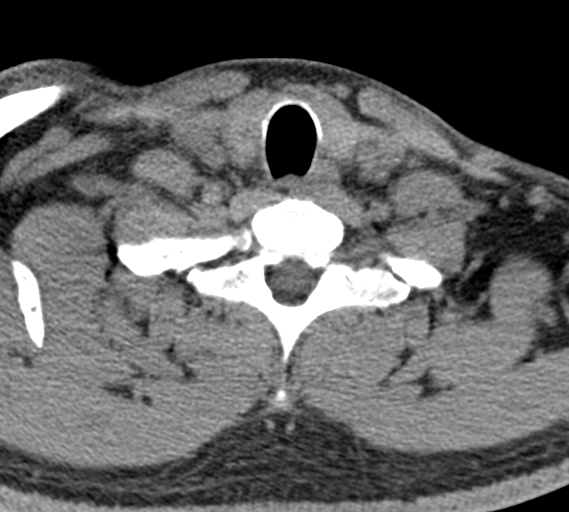
[im 31/123  bone]
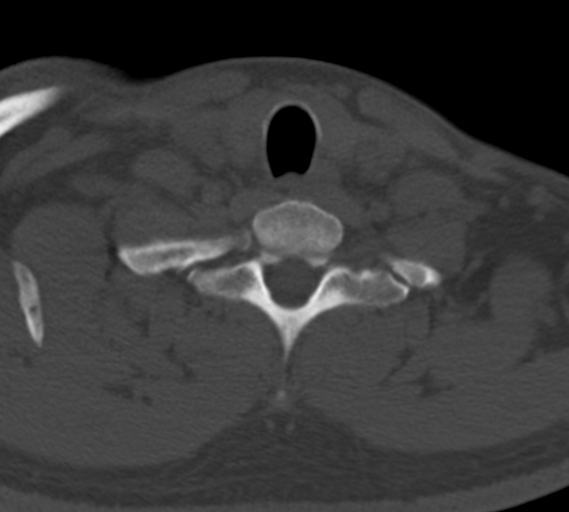
[im 62/123  bone]
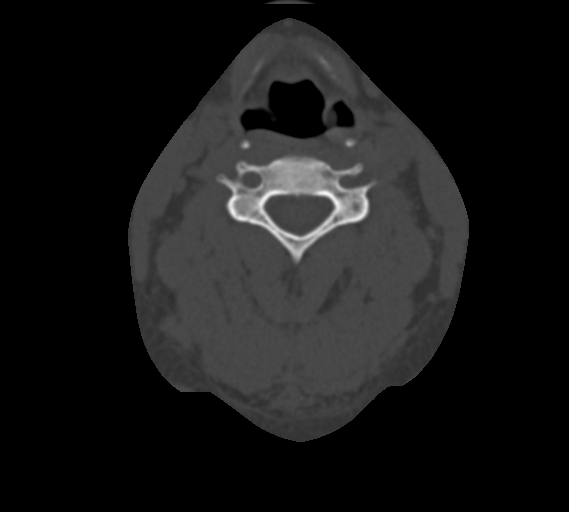
[im 92/123  bone]
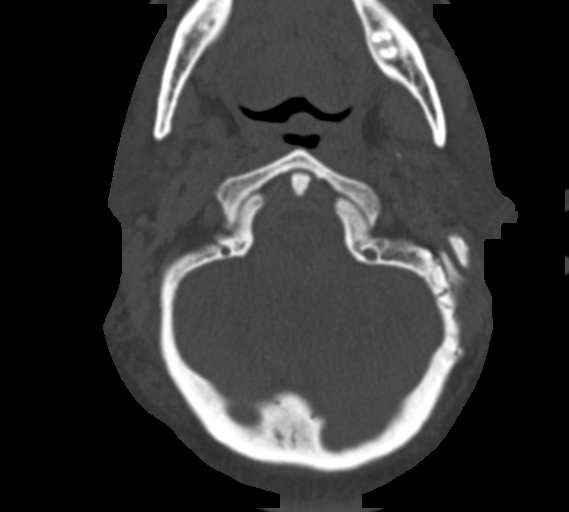

[Series 5: cor neck · coronal · 0.51mm/px · 3 of 102 slices shown]
[im 25/102  bone]
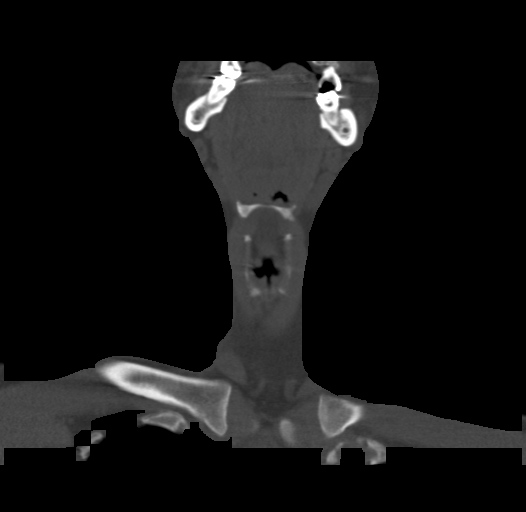
[im 42/102  bone]
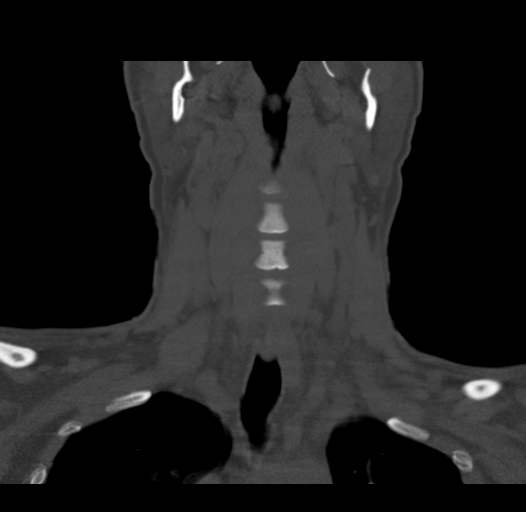
[im 60/102  bone]
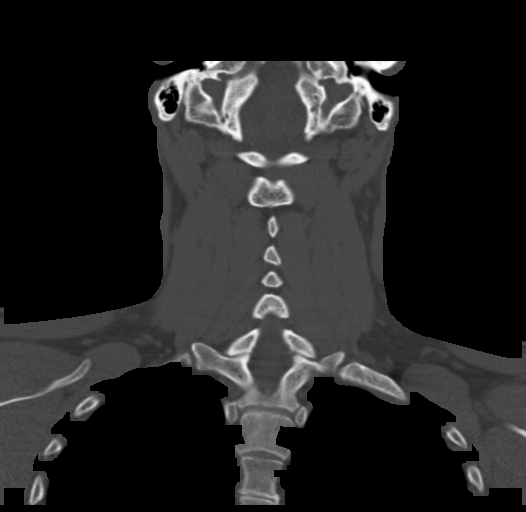

[Series 6: sag neck · sagittal · 0.52mm/px · 5 of 101 slices shown, 6 images]
[im 34/101  bone]
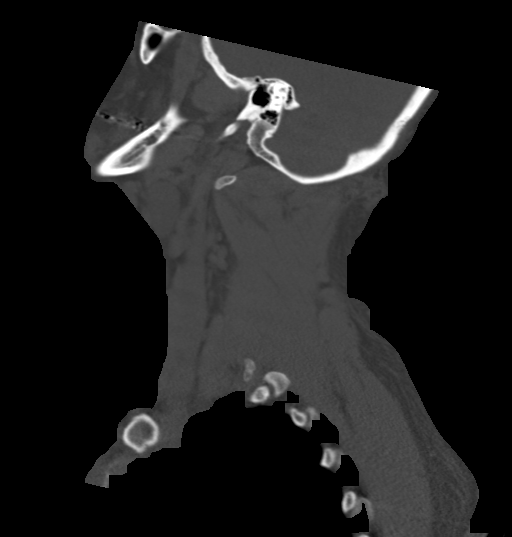
[im 42/101  bone]
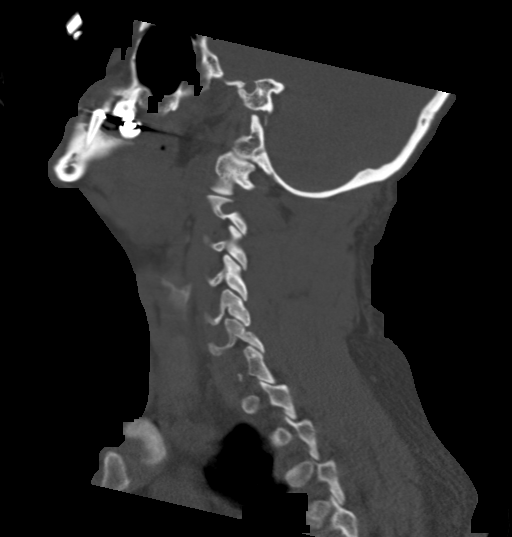
[im 51/101  soft-tissue]
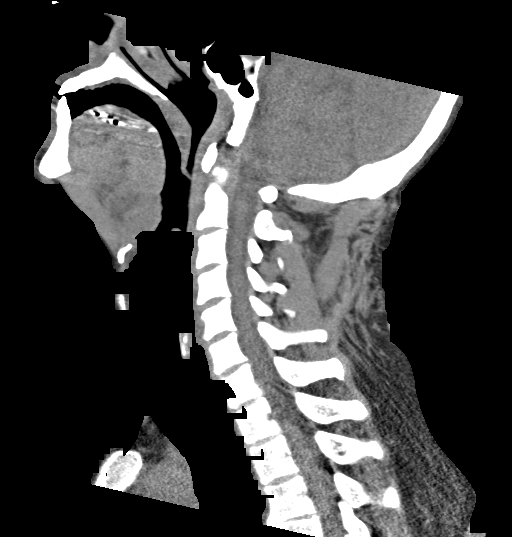
[im 51/101  bone]
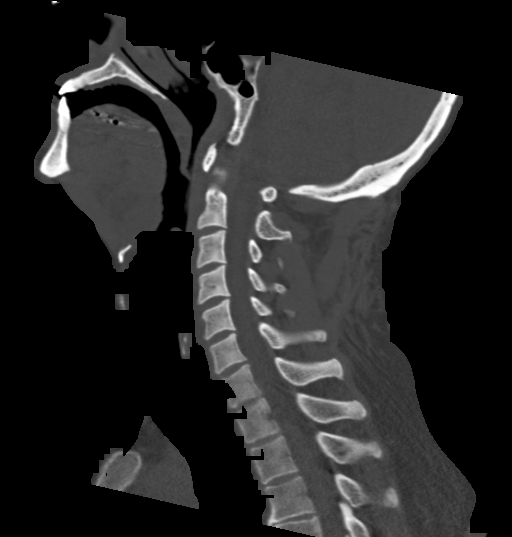
[im 59/101  bone]
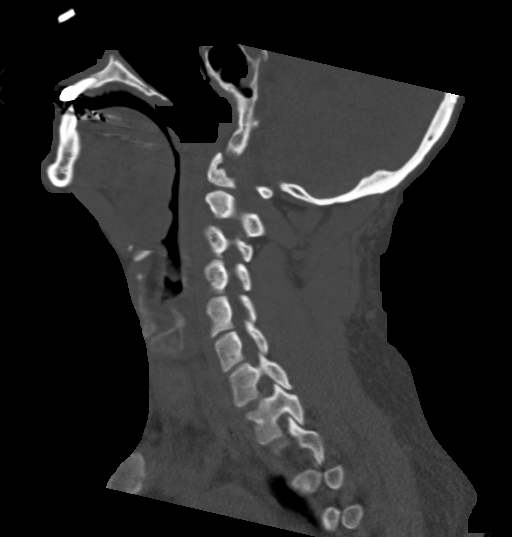
[im 67/101  bone]
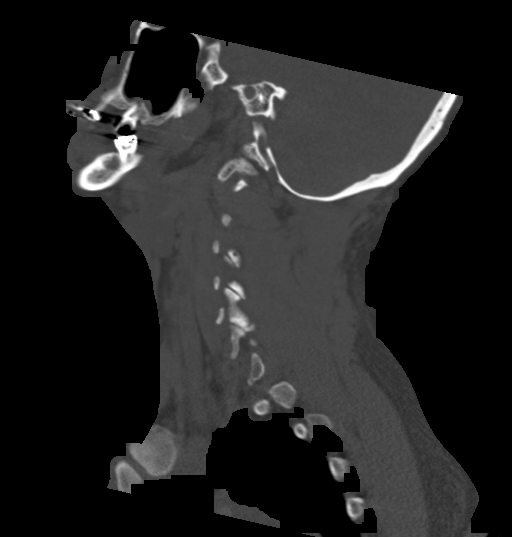

[Series 8: orthogonal ax · axial · 0.39mm/px · z∈[-228,-102]mm · 3 of 130 slices shown (2 of 2)]
[im 33/130  bone]
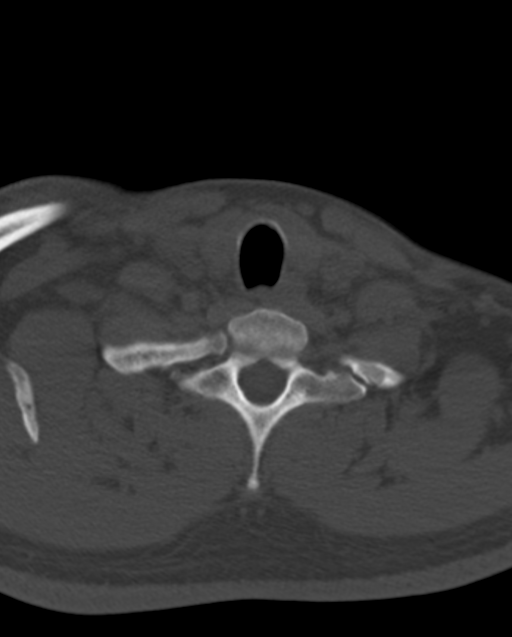
[im 65/130  bone]
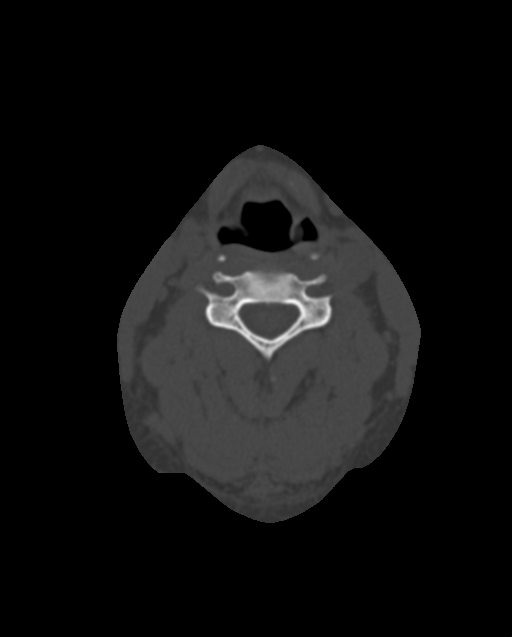
[im 97/130  bone]
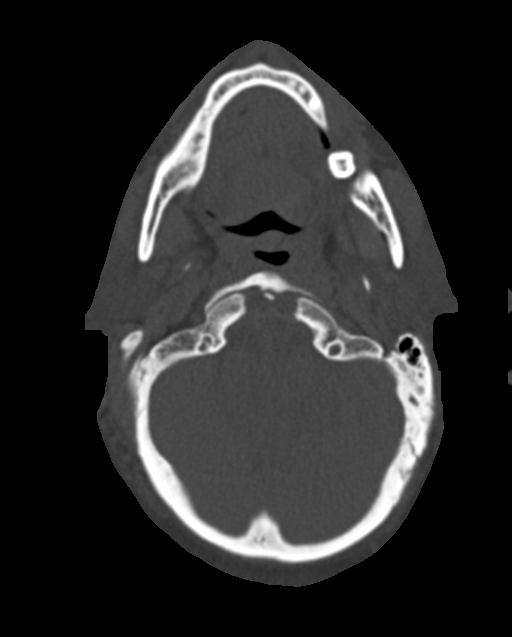

[16 of 33 positions shown; findings below may reference images not displayed]

FINDINGS: Pharynx and larynx: Unfortunate motion at the level of the larynx.
Along the posterior wall of the hypopharynx are 3 blurry areas of
superficial high-density which on especially reformats is best
attributed to artifact. Would not expect foreign body to lodged in
this location or plastic to be this dense. Submucosally high-density
is from ossified thyroid cartilage. There is no superimposed
swelling or airway narrowing.

Salivary glands: No inflammation, mass, or stone.

Thyroid: Normal.

Lymph nodes: None enlarged or abnormal density.

Vascular: Negative

Limited intracranial: Negative

Visualized orbits: Not covered

Mastoids and visualized paranasal sinuses: Clear

Skeleton: Unremarkable

Upper chest: Negative
IMPRESSION: Unfortunate motion at the level of the throat. A few high-density
foci along the posterior wall of the hypopharynx are best attributed
to motion, fiberoptic visualization could visualize if persistent
symptoms. There is no associated airway swelling or narrowing.

## 2021-07-14 IMAGING — CR DG NECK SOFT TISSUE
2 series · 2 of 2 positions shown · non-contrast
Comparison: None.

CLINICAL DATA: 43-year-old male swallowed a piece of straw.

EXAM:
NECK SOFT TISSUES - 1+ VIEW

[w soft tissue neck ap]
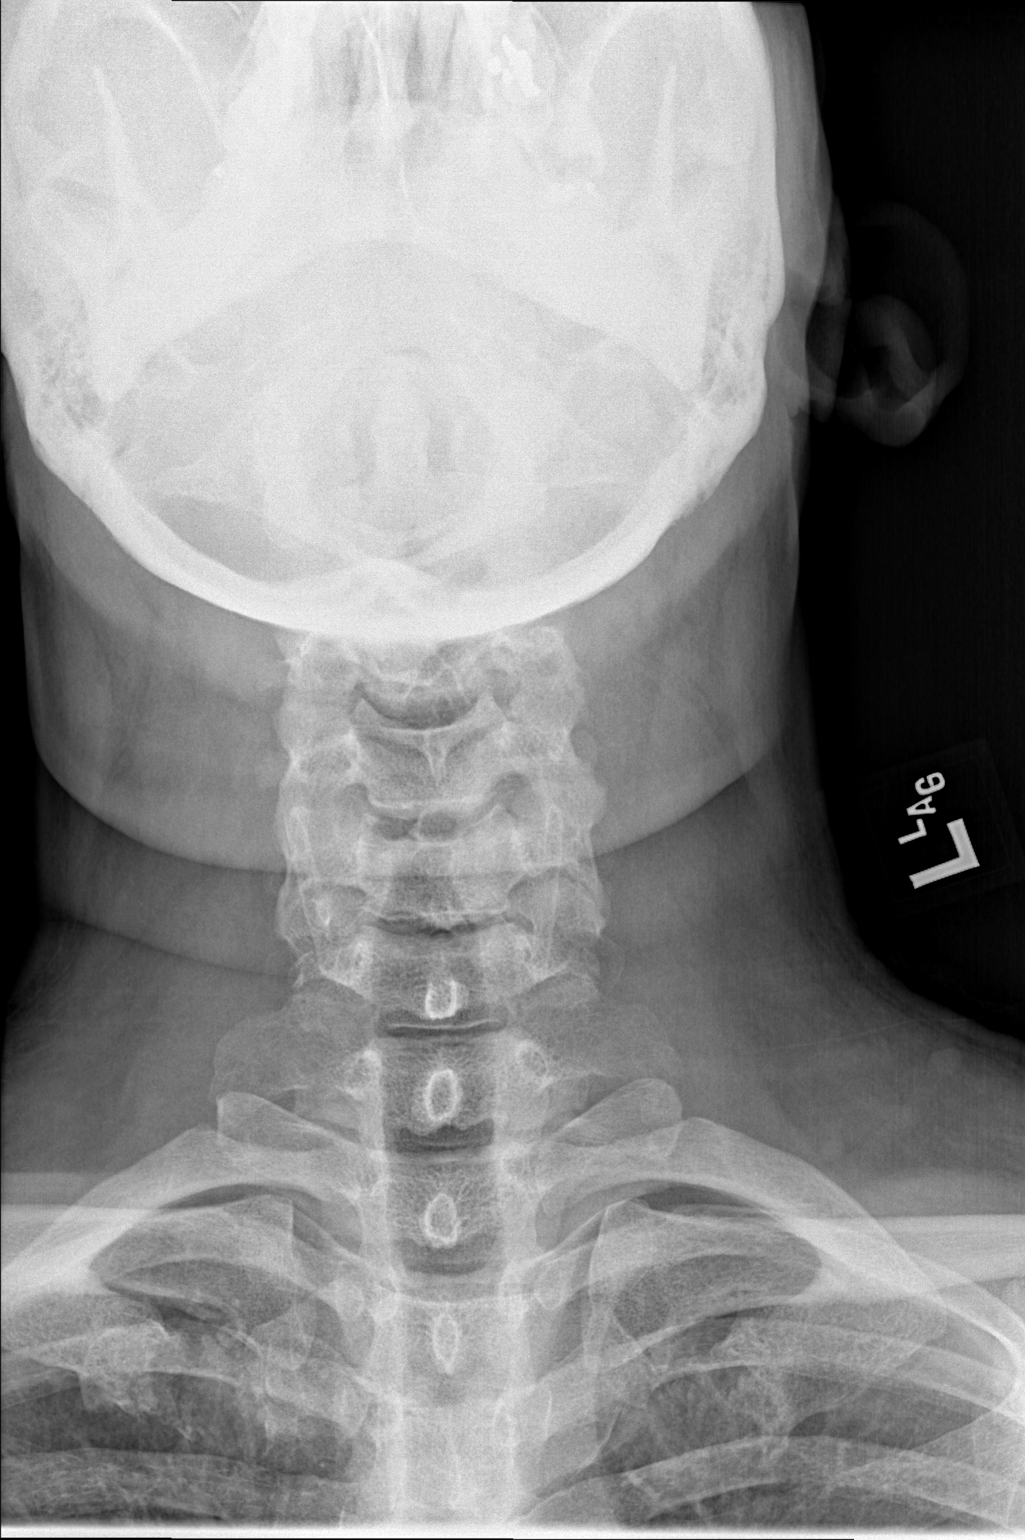

[w soft tissue neck lat]
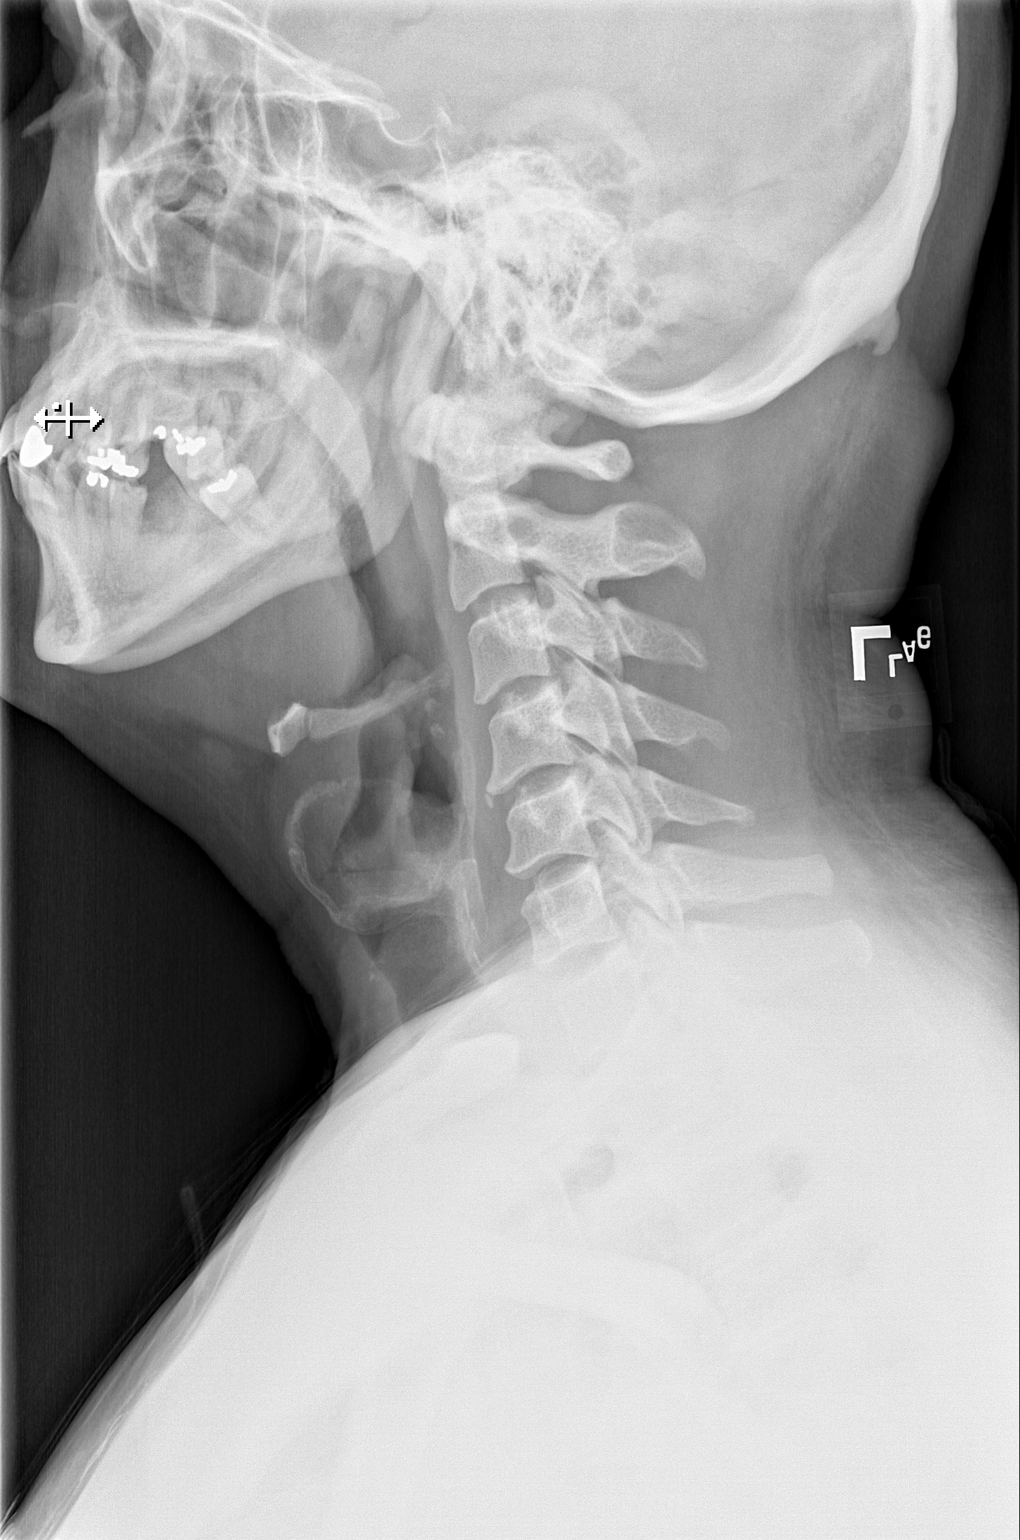

[2 of 2 positions shown; findings below may reference images not displayed]

FINDINGS: There is no evidence of retropharyngeal soft tissue swelling or
epiglottic enlargement. The cervical airway is unremarkable and no
radio-opaque foreign body identified.
IMPRESSION: Negative.

## 2024-02-03 ENCOUNTER — Other Ambulatory Visit: Payer: Self-pay

## 2024-02-03 ENCOUNTER — Emergency Department (HOSPITAL_BASED_OUTPATIENT_CLINIC_OR_DEPARTMENT_OTHER)
Admission: EM | Admit: 2024-02-03 | Discharge: 2024-02-03 | Disposition: A | Discharge status: Home / Self Care | Payer: No Typology Code available for payment source | Attending: Emergency Medicine | Admitting: Emergency Medicine

## 2024-02-03 ENCOUNTER — Emergency Department (HOSPITAL_BASED_OUTPATIENT_CLINIC_OR_DEPARTMENT_OTHER): Payer: No Typology Code available for payment source

## 2024-02-03 DIAGNOSIS — M25512 Pain in left shoulder: Secondary | ICD-10-CM | POA: Insufficient documentation

## 2024-02-03 DIAGNOSIS — S39012A Strain of muscle, fascia and tendon of lower back, initial encounter: Secondary | ICD-10-CM | POA: Diagnosis not present

## 2024-02-03 DIAGNOSIS — Y92410 Unspecified street and highway as the place of occurrence of the external cause: Secondary | ICD-10-CM | POA: Diagnosis not present

## 2024-02-03 DIAGNOSIS — S3992XA Unspecified injury of lower back, initial encounter: Secondary | ICD-10-CM | POA: Diagnosis present

## 2024-02-03 MED ORDER — ETODOLAC 400 MG PO TABS: 400.0000 mg | ORAL_TABLET | Freq: Two times a day (BID) | ORAL | 0 refills | Status: DC

## 2024-02-03 MED ORDER — HYDROCODONE-ACETAMINOPHEN 5-325 MG PO TABS
1.0000 | ORAL_TABLET | Freq: Once | ORAL | Status: AC
  Administered 2024-02-03: 1 via ORAL
  Filled 2024-02-03: qty 1

## 2024-02-03 MED ORDER — METHOCARBAMOL 500 MG PO TABS
500.0000 mg | ORAL_TABLET | Freq: Once | ORAL | Status: AC
  Administered 2024-02-03: 500 mg via ORAL
  Filled 2024-02-03: qty 1

## 2024-02-03 MED ORDER — LIDOCAINE 5 % EX PTCH
1.0000 | MEDICATED_PATCH | CUTANEOUS | Status: DC
  Administered 2024-02-03: 1 via TRANSDERMAL
  Filled 2024-02-03: qty 1

## 2024-02-03 MED ORDER — KETOROLAC TROMETHAMINE 15 MG/ML IJ SOLN
15.0000 mg | Freq: Once | INTRAMUSCULAR | Status: AC
  Administered 2024-02-03: 15 mg via INTRAMUSCULAR
  Filled 2024-02-03: qty 1

## 2024-02-03 MED ORDER — METHOCARBAMOL 500 MG PO TABS: 500.0000 mg | ORAL_TABLET | Freq: Two times a day (BID) | ORAL | 0 refills | Status: DC

## 2024-02-03 MED ORDER — LIDOCAINE 5 % EX PTCH: 1.0000 | MEDICATED_PATCH | CUTANEOUS | 0 refills | Status: DC
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
## 2024-02-03 NOTE — Discharge Instructions (Addendum)
 Follow-up with your PCP.  Return for any concerning symptoms.  You likely have a muscle strain.  I sent a few medications into the pharmacy for you.  Lodine  is an anti-inflammatory medication.  Do not combine this with Advil  or Aleve.  Robaxin  is a muscle relaxer.  They will make you drowsy.  Do not drive or do anything else dangerous after taking this medicine.

## 2024-07-19 ENCOUNTER — Other Ambulatory Visit: Payer: Self-pay

## 2024-07-19 ENCOUNTER — Encounter (HOSPITAL_BASED_OUTPATIENT_CLINIC_OR_DEPARTMENT_OTHER): Payer: Self-pay | Admitting: Orthopedic Surgery

## 2024-07-27 ENCOUNTER — Encounter (HOSPITAL_BASED_OUTPATIENT_CLINIC_OR_DEPARTMENT_OTHER)
Admission: RE | Disposition: A | Discharge status: Home / Self Care | Payer: Self-pay | Source: Home / Self Care | Attending: Orthopedic Surgery

## 2024-07-27 ENCOUNTER — Ambulatory Visit (HOSPITAL_BASED_OUTPATIENT_CLINIC_OR_DEPARTMENT_OTHER): Admitting: Anesthesiology

## 2024-07-27 ENCOUNTER — Ambulatory Visit (HOSPITAL_BASED_OUTPATIENT_CLINIC_OR_DEPARTMENT_OTHER)
Admission: RE | Admit: 2024-07-27 | Discharge: 2024-07-27 | Disposition: A | Discharge status: Home / Self Care | Attending: Orthopedic Surgery | Admitting: Orthopedic Surgery

## 2024-07-27 DIAGNOSIS — M7522 Bicipital tendinitis, left shoulder: Secondary | ICD-10-CM | POA: Insufficient documentation

## 2024-07-27 DIAGNOSIS — M25812 Other specified joint disorders, left shoulder: Secondary | ICD-10-CM | POA: Insufficient documentation

## 2024-07-27 DIAGNOSIS — M7502 Adhesive capsulitis of left shoulder: Secondary | ICD-10-CM | POA: Diagnosis present

## 2024-07-27 DIAGNOSIS — M7552 Bursitis of left shoulder: Secondary | ICD-10-CM | POA: Insufficient documentation

## 2024-07-27 DIAGNOSIS — S43432A Superior glenoid labrum lesion of left shoulder, initial encounter: Secondary | ICD-10-CM | POA: Diagnosis not present

## 2024-07-27 DIAGNOSIS — Z5986 Financial insecurity: Secondary | ICD-10-CM | POA: Diagnosis not present

## 2024-07-27 DIAGNOSIS — K219 Gastro-esophageal reflux disease without esophagitis: Secondary | ICD-10-CM | POA: Diagnosis not present

## 2024-07-27 HISTORY — PX: POSTERIOR LUMBAR FUSION 2 WITH HARDWARE REMOVAL: SHX7297

## 2024-07-27 HISTORY — PX: BICEPT TENODESIS: SHX5116

## 2024-07-27 HISTORY — PX: SUBACROMIAL DECOMPRESSION: SHX5174

## 2024-07-27 HISTORY — PX: THYMECTOMY, ROBOT-ASSISTED: SHX7249

## 2024-07-27 SURGERY — ARTHROSCOPY, SHOULDER WITH DEBRIDEMENT
Anesthesia: General | Laterality: Left

## 2024-07-27 MED ORDER — SODIUM CHLORIDE 0.9 % IR SOLN
Status: DC | PRN
  Administered 2024-07-27: 3000 mL

## 2024-07-27 MED ORDER — PROPOFOL 10 MG/ML IV BOLUS
INTRAVENOUS | Status: DC | PRN
  Administered 2024-07-27: 150 mg via INTRAVENOUS
  Administered 2024-07-27: 50 mg via INTRAVENOUS

## 2024-07-27 MED ORDER — FENTANYL CITRATE (PF) 100 MCG/2ML IJ SOLN: 25.0000 ug | INTRAMUSCULAR | Status: DC | PRN

## 2024-07-27 MED ORDER — LIDOCAINE 2% (20 MG/ML) 5 ML SYRINGE
INTRAMUSCULAR | Status: AC
Start: 2024-07-27 — End: 2024-07-27
  Filled 2024-07-27: qty 5

## 2024-07-27 MED ORDER — KETOROLAC TROMETHAMINE 30 MG/ML IJ SOLN
INTRAMUSCULAR | Status: DC | PRN
Start: 2024-07-27 — End: 2024-07-27
  Administered 2024-07-27: 30 mg via INTRAVENOUS

## 2024-07-27 MED ORDER — PROPOFOL 10 MG/ML IV BOLUS
INTRAVENOUS | Status: AC
Start: 2024-07-27 — End: 2024-07-27
  Filled 2024-07-27: qty 20

## 2024-07-27 MED ORDER — ACETAMINOPHEN 500 MG PO TABS
1000.0000 mg | ORAL_TABLET | Freq: Once | ORAL | Status: AC
  Administered 2024-07-27: 1000 mg via ORAL

## 2024-07-27 MED ORDER — DEXAMETHASONE SODIUM PHOSPHATE 4 MG/ML IJ SOLN
INTRAMUSCULAR | Status: DC | PRN
  Administered 2024-07-27: 10 mg via INTRAVENOUS

## 2024-07-27 MED ORDER — OXYCODONE-ACETAMINOPHEN 5-325 MG PO TABS: 1.0000 | ORAL_TABLET | ORAL | 0 refills | Status: DC | PRN

## 2024-07-27 MED ORDER — OXYCODONE HCL 5 MG/5ML PO SOLN: 5.0000 mg | Freq: Once | ORAL | Status: DC | PRN

## 2024-07-27 MED ORDER — ACETAMINOPHEN 500 MG PO TABS
ORAL_TABLET | ORAL | Status: AC
  Filled 2024-07-27: qty 2

## 2024-07-27 MED ORDER — CEFAZOLIN SODIUM-DEXTROSE 2-4 GM/100ML-% IV SOLN
INTRAVENOUS | Status: AC
  Filled 2024-07-27: qty 100

## 2024-07-27 MED ORDER — FENTANYL CITRATE (PF) 100 MCG/2ML IJ SOLN
100.0000 ug | Freq: Once | INTRAMUSCULAR | Status: AC
  Administered 2024-07-27: 100 ug via INTRAVENOUS

## 2024-07-27 MED ORDER — ROCURONIUM BROMIDE 100 MG/10ML IV SOLN
INTRAVENOUS | Status: DC | PRN
Start: 2024-07-27 — End: 2024-07-27
  Administered 2024-07-27: 60 mg via INTRAVENOUS

## 2024-07-27 MED ORDER — KETOROLAC TROMETHAMINE 30 MG/ML IJ SOLN
INTRAMUSCULAR | Status: AC
Start: 2024-07-27 — End: 2024-07-27
  Filled 2024-07-27: qty 1

## 2024-07-27 MED ORDER — OXYCODONE HCL 5 MG PO TABS: 5.0000 mg | ORAL_TABLET | Freq: Once | ORAL | Status: DC | PRN

## 2024-07-27 MED ORDER — DEXAMETHASONE SODIUM PHOSPHATE 10 MG/ML IJ SOLN
INTRAMUSCULAR | Status: AC
  Filled 2024-07-27: qty 1

## 2024-07-27 MED ORDER — LACTATED RINGERS IV SOLN: INTRAVENOUS | Status: DC

## 2024-07-27 MED ORDER — MIDAZOLAM HCL 2 MG/2ML IJ SOLN
INTRAMUSCULAR | Status: AC
  Filled 2024-07-27: qty 2

## 2024-07-27 MED ORDER — BUPIVACAINE LIPOSOME 1.3 % IJ SUSP
INTRAMUSCULAR | Status: DC | PRN
  Administered 2024-07-27: 10 mL via PERINEURAL

## 2024-07-27 MED ORDER — SUGAMMADEX SODIUM 200 MG/2ML IV SOLN
INTRAVENOUS | Status: DC | PRN
  Administered 2024-07-27: 200 mg via INTRAVENOUS

## 2024-07-27 MED ORDER — DROPERIDOL 2.5 MG/ML IJ SOLN: 0.6250 mg | Freq: Once | INTRAMUSCULAR | Status: DC | PRN

## 2024-07-27 MED ORDER — CEFAZOLIN SODIUM-DEXTROSE 2-4 GM/100ML-% IV SOLN
2.0000 g | INTRAVENOUS | Status: AC
  Administered 2024-07-27: 2 g via INTRAVENOUS

## 2024-07-27 MED ORDER — ROCURONIUM BROMIDE 10 MG/ML (PF) SYRINGE
PREFILLED_SYRINGE | INTRAVENOUS | Status: AC
Start: 2024-07-27 — End: 2024-07-27
  Filled 2024-07-27: qty 10

## 2024-07-27 MED ORDER — FENTANYL CITRATE (PF) 100 MCG/2ML IJ SOLN
INTRAMUSCULAR | Status: AC
  Filled 2024-07-27: qty 2

## 2024-07-27 MED ORDER — BUPIVACAINE-EPINEPHRINE (PF) 0.5% -1:200000 IJ SOLN
INTRAMUSCULAR | Status: DC | PRN
  Administered 2024-07-27: 15 mL via PERINEURAL

## 2024-07-27 MED ORDER — ONDANSETRON HCL 4 MG/2ML IJ SOLN
INTRAMUSCULAR | Status: DC | PRN
  Administered 2024-07-27: 4 mg via INTRAVENOUS

## 2024-07-27 MED ORDER — ONDANSETRON HCL 4 MG/2ML IJ SOLN
INTRAMUSCULAR | Status: AC
Start: 2024-07-27 — End: 2024-07-27
  Filled 2024-07-27: qty 2

## 2024-07-27 MED ORDER — MIDAZOLAM HCL 2 MG/2ML IJ SOLN
2.0000 mg | Freq: Once | INTRAMUSCULAR | Status: AC
Start: 2024-07-27 — End: 2024-07-27
  Administered 2024-07-27: 2 mg via INTRAVENOUS

## 2024-07-27 MED ORDER — LIDOCAINE HCL (CARDIAC) PF 100 MG/5ML IV SOSY
PREFILLED_SYRINGE | INTRAVENOUS | Status: DC | PRN
Start: 2024-07-27 — End: 2024-07-27
  Administered 2024-07-27: 50 mg via INTRAVENOUS

## 2024-07-27 MED ORDER — ONDANSETRON 4 MG PO TBDP: 4.0000 mg | ORAL_TABLET | Freq: Three times a day (TID) | ORAL | 0 refills | Status: AC | PRN

## 2024-07-27 SURGICAL SUPPLY — 42 items
BURR OVAL 8 FLU 4.0X13 (MISCELLANEOUS) ×1 IMPLANT
CANNULA 5.75X71 LONG (CANNULA) IMPLANT
CANNULA PASSPORT 5 (CANNULA) IMPLANT
CANNULA PASSPORT BUTTON 10-40 (CANNULA) IMPLANT
CANNULA TWIST IN 8.25X7CM (CANNULA) IMPLANT
CLSR STERI-STRIP ANTIMIC 1/2X4 (GAUZE/BANDAGES/DRESSINGS) ×1 IMPLANT
COOLER ICEMAN CLASSIC (MISCELLANEOUS) ×1 IMPLANT
CUTTER BONE 4.0MM X 13CM (MISCELLANEOUS) ×1 IMPLANT
DRAPE INCISE IOBAN 66X45 STRL (DRAPES) ×1 IMPLANT
DRAPE STERI 35X30 U-POUCH (DRAPES) ×1 IMPLANT
DRAPE SURG 17X23 STRL (DRAPES) ×1 IMPLANT
DRAPE U-SHAPE 47X51 STRL (DRAPES) ×1 IMPLANT
DRAPE U-SHAPE 76X120 STRL (DRAPES) ×2 IMPLANT
DURAPREP 26ML APPLICATOR (WOUND CARE) ×1 IMPLANT
GAUZE PAD ABD 8X10 STRL (GAUZE/BANDAGES/DRESSINGS) ×1 IMPLANT
GAUZE SPONGE 4X4 12PLY STRL (GAUZE/BANDAGES/DRESSINGS) ×1 IMPLANT
GLOVE BIO SURGEON STRL SZ7.5 (GLOVE) ×2 IMPLANT
GLOVE BIOGEL PI IND STRL 8 (GLOVE) ×2 IMPLANT
GOWN STRL REUS W/ TWL LRG LVL3 (GOWN DISPOSABLE) ×1 IMPLANT
GOWN STRL REUS W/TWL XL LVL3 (GOWN DISPOSABLE) ×2 IMPLANT
MANIFOLD NEPTUNE II (INSTRUMENTS) ×1 IMPLANT
NDL HD SCORPION MEGA LOADER (NEEDLE) IMPLANT
NDL SAFETY ECLIPSE 18X1.5 (NEEDLE) ×1 IMPLANT
PACK ARTHROSCOPY DSU (CUSTOM PROCEDURE TRAY) ×1 IMPLANT
PACK BASIN DAY SURGERY FS (CUSTOM PROCEDURE TRAY) ×1 IMPLANT
PAD COLD SHLDR WRAP-ON (PAD) ×1 IMPLANT
SLEEVE ARM SUSPENSION SYSTEM (MISCELLANEOUS) ×1 IMPLANT
SLEEVE SCD COMPRESS KNEE MED (STOCKING) ×1 IMPLANT
SLING ARM FOAM STRAP LRG (SOFTGOODS) IMPLANT
SLING ARM IMMOBILIZER LRG (SOFTGOODS) IMPLANT
SLING S3 LATERAL DISP (MISCELLANEOUS) IMPLANT
STRIP CLOSURE SKIN 1/2X4 (GAUZE/BANDAGES/DRESSINGS) IMPLANT
SUT MNCRL AB 3-0 PS2 18 (SUTURE) ×1 IMPLANT
SUT PDS AB 1 CT 36 (SUTURE) IMPLANT
SUTURE TAPE TIGERLINK 1.3MM BL (SUTURE) IMPLANT
SYR 5ML LL (SYRINGE) ×1 IMPLANT
TAPE CLOTH SURG 4X10 WHT LF (GAUZE/BANDAGES/DRESSINGS) IMPLANT
TAPE FIBER 2MM 7IN #2 BLUE (SUTURE) IMPLANT
TOWEL GREEN STERILE FF (TOWEL DISPOSABLE) ×2 IMPLANT
TUBE CONNECTING 20X1/4 (TUBING) ×1 IMPLANT
TUBING ARTHROSCOPY IRRIG 16FT (MISCELLANEOUS) ×1 IMPLANT
WAND ABLATOR APOLLO I90 (BUR) ×1 IMPLANT

## 2024-07-27 NOTE — Anesthesia Procedure Notes (Signed)
 Anesthesia Regional Block: Interscalene brachial plexus block   Pre-Anesthetic Checklist: , timeout performed,  Correct Patient, Correct Site, Correct Laterality,  Correct Procedure, Correct Position, site marked,  Risks and benefits discussed,  Surgical consent,  Pre-op evaluation,  At surgeon's request and post-op pain management  Laterality: Left and Upper  Prep: chloraprep       Needles:  Injection technique: Single-shot  Needle Type: Echogenic Needle     Needle Length: 9cm  Needle Gauge: 21     Additional Needles:   Procedures:,,,, ultrasound used (permanent image in chart),,    Narrative:  Start time: 07/27/2024 12:40 PM End time: 07/27/2024 12:46 PM Injection made incrementally with aspirations every 5 mL.  Performed by: Personally  Anesthesiologist: Leonce Athens, MD  Additional Notes: Pt identified in Holding room.  Monitors applied. Working IV access confirmed. Timeout, Sterile prep L clavicle and neck.  #21ga ECHOgenic Arrow block needle to interscalene brachial plexus with US  guidance.  15cc 0.5% Bupivacaine  1:200k epi, exparel  injected incrementally after negative test dose.  Patient asymptomatic, VSS, no heme aspirated, tolerated well.   JAYSON Leonce, MD

## 2024-07-27 NOTE — Brief Op Note (Signed)
 07/27/2024  2:09 PM  PATIENT:  Andrew Park  48 y.o. male  PRE-OPERATIVE DIAGNOSIS:  Anterior to posterior tear of superior glenoid labrum of left shoulder  POST-OPERATIVE DIAGNOSIS:  Anterior to posterior tear of superior glenoid labrum of left shoulder  PROCEDURE:  Procedure(s): ARTHROSCOPY, SHOULDER WITH DEBRIDEMENT (Left) DECOMPRESSION, SUBACROMIAL SPACE (Left) TENOTOMY BICEPS (Left) RELEASE, JOINT CAPSULE, SHOULDER, ARTHROSCOPIC (Left)  SURGEON:  Surgeons and Role:    * Sharl Selinda Dover, MD - Primary  PHYSICIAN ASSISTANT: Dayle Moores, PA-C   ANESTHESIA:   regional and general  EBL:  5 mL   BLOOD ADMINISTERED:none  DRAINS: none   LOCAL MEDICATIONS USED:  NONE  SPECIMEN:  No Specimen  DISPOSITION OF SPECIMEN:  N/A  COUNTS:  YES  TOURNIQUET:  * No tourniquets in log *  DICTATION: .Note written in EPIC  PLAN OF CARE: Discharge to home after PACU  PATIENT DISPOSITION:  PACU - hemodynamically stable.   Delay start of Pharmacological VTE agent (>24hrs) due to surgical blood loss or risk of bleeding: not applicable

## 2024-07-27 NOTE — Transfer of Care (Signed)
 Immediate Anesthesia Transfer of Care Note  Patient: Andrew Park  Procedure(s) Performed: ARTHROSCOPY, SHOULDER WITH DEBRIDEMENT (Left) DECOMPRESSION, SUBACROMIAL SPACE (Left) TENOTOMY BICEPS (Left) RELEASE, JOINT CAPSULE, SHOULDER, ARTHROSCOPIC (Left)  Patient Location: PACU  Anesthesia Type:General  Level of Consciousness: awake, alert , and oriented  Airway & Oxygen Therapy: Patient Spontanous Breathing and Patient connected to face mask oxygen  Post-op Assessment: Report given to RN and Post -op Vital signs reviewed and stable  Post vital signs: Reviewed and stable  Last Vitals:  Vitals Value Taken Time  BP 136/63 07/27/24 14:13  Temp    Pulse 83 07/27/24 14:15  Resp 20 07/27/24 14:15  SpO2 97 % 07/27/24 14:15  Vitals shown include unfiled device data.  Last Pain:  Vitals:   07/27/24 1236  TempSrc: Temporal  PainSc: 2       Patients Stated Pain Goal: 3 (07/27/24 1236)  Complications: No notable events documented.

## 2024-07-27 NOTE — H&P (Signed)
 ORTHOPAEDIC H and P  REQUESTING PHYSICIAN: Sharl Selinda Dover, MD  PCP:  Patient, No Pcp Per  Chief Complaint: Left shoulder pain  HPI: Andrew Park is a 48 y.o. male who complains of worsening left shoulder pain including stiffness and an ability to perform activities of daily living and vocational activities.  Here today for arthroscopic intervention.  Past Medical History:  Diagnosis Date   GERD (gastroesophageal reflux disease)    occ   Past Surgical History:  Procedure Laterality Date   APPENDECTOMY     INGUINAL HERNIA REPAIR Left 01/21/2017   Procedure: LAPAROSCOPIC LEFT  INGUINAL HERNIA;  Surgeon: Lynda Leos, MD;  Location: [facility] OR;  Service: General;  Laterality: Left;   INSERTION OF MESH Left 01/21/2017   Procedure: INSERTION OF MESH;  Surgeon: Lynda Leos, MD;  Location: [facility] OR;  Service: General;  Laterality: Left;   TONSILLECTOMY     Social History   Socioeconomic History   Marital status: Married    Spouse name: [name]   Number of children: Not on file   Years of education: Not on file   Highest education level: Not on file  Occupational History   Not on file  Tobacco Use   Smoking status: Never   Smokeless tobacco: Never  Vaping Use   Vaping status: Never Used  Substance and Sexual Activity   Alcohol use: No   Drug use: No   Sexual activity: Yes  Other Topics Concern   Not on file  Social History Narrative   Not on file   Social Drivers of Health   Financial Resource Strain: Medium Risk (05/09/2024)   Received from [facility]   Overall Financial Resource Strain (CARDIA)    Difficulty of Paying Living Expenses: Somewhat hard  Food Insecurity: No Food Insecurity (05/09/2024)   Received from [facility]   Hunger Vital Sign    Within the past 12 months, you worried that your food would run out before you got the money to buy more.: Never true    Within the past 12 months, the food you bought just didn't last and you didn't have money  to get more.: Never true  Transportation Needs: No Transportation Needs (05/09/2024)   Received from [facility] - Transportation    Lack of Transportation (Medical): No    Lack of Transportation (Non-Medical): No  Physical Activity: Not on file  Stress: Not on file  Social Connections: Unknown (10/14/2023)   Received from [facility]   Social Network    Social Network: Not on file   History reviewed. No pertinent family history. Allergies  Allergen Reactions   Shrimp [Shellfish Allergy] Itching, Swelling and Other (See Comments)    Reaction:  Lip swelling    Prior to Admission medications   Medication Sig Start Date End Date Taking? Authorizing Provider  methocarbamol  (ROBAXIN ) 500 MG tablet Take 1 tablet (500 mg total) by mouth 2 (two) times daily. 02/03/24  Yes Ali, Amjad, PA-C  acetaminophen  (TYLENOL ) 500 MG tablet Take 2 tablets (1,000 mg total) by mouth every 8 (eight) hours as needed. Patient not taking: Reported on 01/19/2017 09/16/14   Armenta Canning, MD  ibuprofen  (ADVIL ) 600 MG tablet Take 1 tablet (600 mg total) by mouth every 6 (six) hours as needed for mild pain or moderate pain. 01/02/20   Keith Sor, PA-C  lidocaine  (LIDODERM ) 5 % Place 1 patch onto the skin daily. Remove & Discard patch within 12 hours or as directed  by MD 02/03/24   Hildegard Loge, PA-C  oxyCODONE -acetaminophen  (ROXICET) 5-325 MG tablet Take 1-2 tablets by mouth every 4 (four) hours as needed. Patient not taking: Reported on 02/22/2017 01/21/17   Rubin Calamity, MD   No results found.  Positive ROS: All other systems have been reviewed and were otherwise negative with the exception of those mentioned in the HPI and as above.  Physical Exam: General: Alert, no acute distress Cardiovascular: No pedal edema Respiratory: No cyanosis, no use of accessory musculature GI: No organomegaly, abdomen is soft and non-tender Skin: No lesions in the area of chief complaint Neurologic: Sensation intact  distally Psychiatric: Patient is competent for consent with normal mood and affect Lymphatic: No axillary or cervical lymphadenopathy  MUSCULOSKELETAL: Left upper extremity is warm and well-perfused with no open wounds or lesions.  Neurovascular intact.  Assessment: 1.  Left shoulder adhesive capsulitis 2.  Left shoulder ongoing traumatic subacromial impingement with bursitis  Plan: Plan to proceed today with arthroscopic surgery of the left shoulder.  We discussed in the office this procedure in detail.  Plan will be for arthroscopic lysis of adhesion, capsular release, biceps tenotomy, debridement as well as subacromial decompression.  We discussed the risk and benefits of the procedure which include but are not limited to bleeding, infection, damage to surrounding nerves and vessels, stiffness, failure of pain relief, need for revision surgery, risk of ongoing stiffness as well as the risk of anesthesia.  He has provided informed consent.  Dc home post op    Selinda Belvie Gosling, MD Cell [phone]    07/27/2024 11:27 AM

## 2024-07-27 NOTE — Anesthesia Preprocedure Evaluation (Addendum)
 Anesthesia Evaluation  Patient identified by MRN, date of birth, ID band Patient awake    Reviewed: Allergy & Precautions, NPO status , Patient's Chart, lab work & pertinent test results  History of Anesthesia Complications Negative for: history of anesthetic complications  Airway Mallampati: II  TM Distance: >3 FB Neck ROM: Full    Dental  (+) Partial Upper,    Pulmonary neg pulmonary ROS   Pulmonary exam normal        Cardiovascular negative cardio ROS Normal cardiovascular exam     Neuro/Psych negative neurological ROS     GI/Hepatic Neg liver ROS,GERD  Controlled,,  Endo/Other  negative endocrine ROS    Renal/GU negative Renal ROS     Musculoskeletal Anterior to posterior tear of superior glenoid labrum of left shoulder   Abdominal   Peds  Hematology negative hematology ROS (+)   Anesthesia Other Findings Day of surgery medications reviewed with patient.  Reproductive/Obstetrics                              Anesthesia Physical Anesthesia Plan  ASA: 2  Anesthesia Plan: General   Post-op Pain Management: Regional block* and Tylenol  PO (pre-op)*   Induction: Intravenous  PONV Risk Score and Plan: 2 and Treatment may vary due to age or medical condition, Ondansetron , Dexamethasone  and Midazolam   Airway Management Planned: Oral ETT  Additional Equipment: None  Intra-op Plan:   Post-operative Plan: Extubation in OR  Informed Consent: I have reviewed the patients History and Physical, chart, labs and discussed the procedure including the risks, benefits and alternatives for the proposed anesthesia with the patient or authorized representative who has indicated his/her understanding and acceptance.     Dental advisory given  Plan Discussed with: CRNA  Anesthesia Plan Comments:          Anesthesia Quick Evaluation

## 2024-07-27 NOTE — Op Note (Signed)
 Date of Surgery: 07/27/2024  INDICATIONS: Andrew Park is a 48 y.o.-year-old male with a left shoulder adhesive capsulitis as well as symptomatic subacromial bursitis with impingement.  Here today for arthroscopic assisted surgery.;  The patient did consent to the procedure after discussion of the risks and benefits.  PREOPERATIVE DIAGNOSIS:  1.  Left shoulder adhesive capsulitis 2.  Left shoulder proximal biceps tendinitis 3.  Left shoulder subacromial impingement  POSTOPERATIVE DIAGNOSIS: Same.  PROCEDURE:  1.  Left shoulder arthroscopic extensive debridement with debridement of anterior labrum, superior labrum, subacromial bursa, rotator interval 2.  Left shoulder arthroscopic 360 degree capsular release 3.  Left shoulder arthroscopic biceps tenotomy 4.  Left shoulder arthroscopic subacromial decompression  SURGEON: Selinda SHAUNNA Gosling, M.D.  ASSIST: Dayle Moores, PA-C  Assistant attestation:  PA McClung scrubbed and present for the entire procedure..  ANESTHESIA:  general, interscalene with Exparel   IV FLUIDS AND URINE: See anesthesia.  ESTIMATED BLOOD LOSS: 10 mL.  IMPLANTS: None  DRAINS: None  COMPLICATIONS: None.  DESCRIPTION OF PROCEDURE: The patient was brought to the operating room and placed supine on the operating table.  The patient had been signed prior to the procedure and this was documented. The patient had the anesthesia placed by the anesthesiologist.  A time-out was performed to confirm that this was the correct patient, site, side and location. The patient did receive antibiotics prior to the incision and was re-dosed during the procedure as needed at indicated intervals.  A tourniquet was not placed.  The patient had the operative extremity prepped and draped in the standard surgical fashion.      After obtaining informed consent the patient was brought to the operating table and underwent satisfactory anesthesia. An exam under anesthesia revealed full range of  motion. He was placed in the left lateral decubitus position with an axillary roll and all bony prominences properly padded. A standard surgical timeout was performed. He was placed in 10 pounds of gentle in-line suspension.  Standard posterior and anterior superior portals were established. A diagnostic evaluation of the glenohumeral joint was performed. The biceps tendon was markedly synovitic and partially torn. It was tenotomized with the RF wand. An extensive debridement was performed of the superior anterior and posterior labrum.  Likewise, the motorized shaver was utilized to debride rotator interval tissue.  The articular surfaces revealed no significant chondromalacia . The subscapularis was intact. The rotator cuff was torn and the greater tuberosity footprint was prepared for repair. Releases were performed on the capsular surface of the rotator cuff.  Of note he did have significant thickening of the capsule as well as erythema consistent with adhesive capsulitis.  At this time we elected to proceed with our arthroscopic capsular release.  While viewing from the posterior portal and working from the mid glenoid portal that was established via spinal needle localization into the rotator interval we moved to work from the 9 o'clock position in this left shoulder all the way around to the 3 o'clock position with radiofrequency wand.  Care was taken to monitor for any axillary nerve activation.  We carefully dissected the capsular tissue off of the deep muscle.  The arthroscope was inserted in the subacromial space and an additional lateral portal was established. An acromioplasty performed nicely decompressing the subacromial space with a motorized burr. Bursitis in the subacromial space was removed as well as releases were performed on the bursal surface of the rotator cuff.   We then remove the arm from the inline traction  device and performed a manipulation of the joint under anesthesia.  I will  forward elevate to 170, externally rotate to 90 and internally rotate to 80.  Abduction easily to 120.  The arthroscope was then removed and portals closed with 3-0 Monocryl in standard fashion followed by a sterile occlusive dressing Polar Care ice sleeve and a slingshot sling. The patient was sent to recovery in stable condition and tolerated the procedure well  POSTOPERATIVE PLAN:  Andrew Park will be placed in his sling until his nerve block resolves.  Once that resolves he will have unrestricted range of motion active, active assist as well as passive.  He can begin using the arm immediately for all activities of daily living and lifting as tolerated.  Will see him back in the office in 2 weeks for wound check.

## 2024-07-27 NOTE — Progress Notes (Signed)
Assisted Dr. Carswell Jackson with left, interscalene , ultrasound guided block. Side rails up, monitors on throughout procedure. See vital signs in flow sheet. Tolerated Procedure well. 

## 2024-07-27 NOTE — Discharge Instructions (Addendum)
 Orthopedic surgery discharge instructions:  -Maintain postoperative bandage for 2 days.  Remove on POD 3, and you may begin showering on postoperative day #3.  Do not submerge underwater.  Maintain that bandage until your follow-up appointment in 2 weeks.  -No lifting over 2 pounds with operateive arm.  You may use the arm immediately for activities of daily living such as bathing, washing your face and brushing your teeth, eating, and getting dressed.    - Sling to be worn for comfort only.  -Apply ice liberally to the shoulder throughout the day.  For mild to moderate pain use Tylenol  and Advil  as needed around-the-clock.  For breakthrough pain use oxycodone  as necessary.  -You will return to see Dr. Sharl in the office in 2 weeks for routine postoperative check with x-rays.   Post Anesthesia Home Care Instructions  Activity: Get plenty of rest for the remainder of the day. A responsible individual must stay with you for 24 hours following the procedure.  For the next 24 hours, DO NOT: -Drive a car -Advertising copywriter -Drink alcoholic beverages -Take any medication unless instructed by your physician -Make any legal decisions or sign important papers.  Meals: Start with liquid foods such as gelatin or soup. Progress to regular foods as tolerated. Avoid greasy, spicy, heavy foods. If nausea and/or vomiting occur, drink only clear liquids until the nausea and/or vomiting subsides. Call your physician if vomiting continues.  Special Instructions/Symptoms: Your throat may feel dry or sore from the anesthesia or the breathing tube placed in your throat during surgery. If this causes discomfort, gargle with warm salt water. The discomfort should disappear within 24 hours.  If you had a scopolamine patch placed behind your ear for the management of post- operative nausea and/or vomiting:  1. The medication in the patch is effective for 72 hours, after which it should be removed.  Wrap  patch in a tissue and discard in the trash. Wash hands thoroughly with soap and water. 2. You may remove the patch earlier than 72 hours if you experience unpleasant side effects which may include dry mouth, dizziness or visual disturbances. 3. Avoid touching the patch. Wash your hands with soap and water after contact with the patch.  Regional Anesthesia Blocks  1. You may not be able to move or feel the blocked extremity after a regional anesthetic block. This may last may last from 3-48 hours after placement, but it will go away. The length of time depends on the medication injected and your individual response to the medication. As the nerves start to wake up, you may experience tingling as the movement and feeling returns to your extremity. If the numbness and inability to move your extremity has not gone away after 48 hours, please call your surgeon.   2. The extremity that is blocked will need to be protected until the numbness is gone and the strength has returned. Because you cannot feel it, you will need to take extra care to avoid injury. Because it may be weak, you may have difficulty moving it or using it. You may not know what position it is in without looking at it while the block is in effect.  3. For blocks in the legs and feet, returning to weight bearing and walking needs to be done carefully. You will need to wait until the numbness is entirely gone and the strength has returned. You should be able to move your leg and foot normally before you try and bear  weight or walk. You will need someone to be with you when you first try to ensure you do not fall and possibly risk injury.  4. Bruising and tenderness at the needle site are common side effects and will resolve in a few days.  5. Persistent numbness or new problems with movement should be communicated to the surgeon or the [facility] [phone] [facility] [phone]).  Information for  Discharge Teaching: EXPAREL  (bupivacaine  liposome injectable suspension)   Pain relief is important to your recovery. The goal is to control your pain so you can move easier and return to your normal activities as soon as possible after your procedure. Your physician may use several types of medicines to manage pain, swelling, and more.  Your surgeon or anesthesiologist gave you EXPAREL (bupivacaine ) to help control your pain after surgery.  EXPAREL  is a local anesthetic designed to release slowly over an extended period of time to provide pain relief by numbing the tissue around the surgical site. EXPAREL  is designed to release pain medication over time and can control pain for up to 72 hours. Depending on how you respond to EXPAREL , you may require less pain medication during your recovery. EXPAREL  can help reduce or eliminate the need for opioids during the first few days after surgery when pain relief is needed the most. EXPAREL  is not an opioid and is not addictive. It does not cause sleepiness or sedation.   Important! A teal colored band has been placed on your arm with the date, time and amount of EXPAREL  you have received. Please leave this armband in place for the full 96 hours following administration, and then you may remove the band. If you return to the hospital for any reason within 96 hours following the administration of EXPAREL , the armband provides important information that your health care providers to know, and alerts them that you have received this anesthetic.    Possible side effects of EXPAREL : Temporary loss of sensation or ability to move in the area where medication was injected. Nausea, vomiting, constipation Rarely, numbness and tingling in your mouth or lips, lightheadedness, or anxiety may occur. Call your doctor right away if you think you may be experiencing any of these sensations, or if you have other questions regarding possible side effects.  Follow all  other discharge instructions given to you by your surgeon or nurse. Eat a healthy diet and drink plenty of water or other fluids.    Next dose of tylenol  will be at 6:30pm  Next dose of ibuprofen  will be at 8:00pm

## 2024-07-27 NOTE — Anesthesia Postprocedure Evaluation (Signed)
 Anesthesia Post Note  Patient: Andrew Park  Procedure(s) Performed: ARTHROSCOPY, SHOULDER WITH DEBRIDEMENT (Left) DECOMPRESSION, SUBACROMIAL SPACE (Left) TENOTOMY BICEPS (Left) RELEASE, JOINT CAPSULE, SHOULDER, ARTHROSCOPIC (Left)     Patient location during evaluation: PACU Anesthesia Type: General Level of consciousness: awake and alert, oriented and patient cooperative Pain management: pain level controlled Vital Signs Assessment: post-procedure vital signs reviewed and stable Respiratory status: spontaneous breathing, nonlabored ventilation and respiratory function stable Cardiovascular status: blood pressure returned to baseline and stable Postop Assessment: no apparent nausea or vomiting, adequate PO intake and able to ambulate Anesthetic complications: no   No notable events documented.  Last Vitals:  Vitals:   07/27/24 1415 07/27/24 1428  BP: (!) 133/95 (!) 126/99  Pulse: 83 81  Resp: 20 19  Temp:    SpO2: 97% 93%    Last Pain:  Vitals:   07/27/24 1428  TempSrc:   PainSc: 0-No pain                 Gwendolyn Nishi,E. Maricus Tanzi

## 2024-07-27 NOTE — Anesthesia Procedure Notes (Signed)
 Procedure Name: Intubation Date/Time: 07/27/2024 1:19 PM  Performed by: Buster Catheryn SAUNDERS, CRNAPre-anesthesia Checklist: Patient identified, Emergency Drugs available, Suction available and Patient being monitored Patient Re-evaluated:Patient Re-evaluated prior to induction Oxygen Delivery Method: Circle system utilized Preoxygenation: Pre-oxygenation with 100% oxygen Induction Type: IV induction Ventilation: Mask ventilation without difficulty Laryngoscope Size: Miller and 2 Grade View: Grade II Tube type: Oral Tube size: 7.0 mm Number of attempts: 1 Airway Equipment and Method: Stylet and Oral airway Placement Confirmation: ETT inserted through vocal cords under direct vision, positive ETCO2 and breath sounds checked- equal and bilateral Secured at: 24 cm Tube secured with: Tape Dental Injury: Teeth and Oropharynx as per pre-operative assessment

## 2024-07-28 ENCOUNTER — Encounter (HOSPITAL_BASED_OUTPATIENT_CLINIC_OR_DEPARTMENT_OTHER): Payer: Self-pay | Admitting: Orthopedic Surgery

## 2024-09-24 ENCOUNTER — Emergency Department (HOSPITAL_COMMUNITY)

## 2024-09-24 ENCOUNTER — Other Ambulatory Visit: Payer: Self-pay

## 2024-09-24 ENCOUNTER — Emergency Department (HOSPITAL_COMMUNITY)
Admission: EM | Admit: 2024-09-24 | Discharge: 2024-09-24 | Disposition: A | Discharge status: Home / Self Care | Source: Ambulatory Visit | Attending: Emergency Medicine | Admitting: Emergency Medicine

## 2024-09-24 DIAGNOSIS — R251 Tremor, unspecified: Secondary | ICD-10-CM | POA: Diagnosis present

## 2024-09-24 DIAGNOSIS — E119 Type 2 diabetes mellitus without complications: Secondary | ICD-10-CM | POA: Insufficient documentation

## 2024-09-24 LAB — COMPREHENSIVE METABOLIC PANEL WITH GFR
ALT: 20 U/L (ref 0–44)
AST: 18 U/L (ref 15–41)
Albumin: 4.8 g/dL (ref 3.5–5.0)
Alkaline Phosphatase: 65 U/L (ref 38–126)
Anion gap: 13 (ref 5–15)
BUN: 16 mg/dL (ref 6–20)
CO2: 24 mmol/L (ref 22–32)
Calcium: 9.7 mg/dL (ref 8.9–10.3)
Chloride: 96 mmol/L — ABNORMAL LOW (ref 98–111)
Creatinine, Ser: 0.81 mg/dL (ref 0.61–1.24)
GFR, Estimated: >60 mL/min (ref >60)
Glucose, Bld: 380 mg/dL — ABNORMAL HIGH (ref 70–99)
Potassium: 4.4 mmol/L (ref 3.5–5.1)
Sodium: 133 mmol/L — ABNORMAL LOW (ref 135–145)
Total Bilirubin: 0.5 mg/dL (ref 0.0–1.2)
Total Protein: 7.8 g/dL (ref 6.5–8.1)

## 2024-09-24 LAB — CBC WITH DIFFERENTIAL/PLATELET
Abs Immature Granulocytes: 0.04 K/uL (ref 0.00–0.07)
Basophils Absolute: 0.1 K/uL (ref 0.0–0.1)
Basophils Relative: 1 %
Eosinophils Absolute: 0.1 K/uL (ref 0.0–0.5)
Eosinophils Relative: 1 %
HCT: 48.5 % (ref 39.0–52.0)
Hemoglobin: 15.7 g/dL (ref 13.0–17.0)
Immature Granulocytes: 0 %
Lymphocytes Relative: 27 %
Lymphs Abs: 2.9 K/uL (ref 0.7–4.0)
MCH: 29.6 pg (ref 26.0–34.0)
MCHC: 32.4 g/dL (ref 30.0–36.0)
MCV: 91.5 fL (ref 80.0–100.0)
Monocytes Absolute: 0.8 K/uL (ref 0.1–1.0)
Monocytes Relative: 7 %
Neutro Abs: 6.9 K/uL (ref 1.7–7.7)
Neutrophils Relative %: 64 %
Platelets: 343 K/uL (ref 150–400)
RBC: 5.3 MIL/uL (ref 4.22–5.81)
RDW: 12.1 % (ref 11.5–15.5)
WBC: 10.8 K/uL — ABNORMAL HIGH (ref 4.0–10.5)
nRBC: 0 % (ref 0.0–0.2)

## 2024-09-24 LAB — MAGNESIUM: Magnesium: 1.9 mg/dL (ref 1.7–2.4)

## 2024-09-24 MED ORDER — METFORMIN HCL 500 MG PO TABS: 500.0000 mg | ORAL_TABLET | Freq: Two times a day (BID) | ORAL | 0 refills | Status: DC

## 2024-09-24 MED ORDER — BACLOFEN 5 MG PO TABS: 5.0000 mg | ORAL_TABLET | Freq: Three times a day (TID) | ORAL | 0 refills | Status: AC

## 2024-09-24 MED ORDER — BACLOFEN 5 MG PO TABS: 5.0000 mg | ORAL_TABLET | Freq: Three times a day (TID) | ORAL | 0 refills | Status: DC

## 2024-09-24 NOTE — ED Provider Triage Note (Signed)
 Emergency Medicine Provider Triage Evaluation Note  TERRIS GERMANO , a 48 y.o. male  was evaluated in triage.  Pt complains of back pain and tremors. Reportedly was seen at [facility] today and advised to come to the ED for evaluation. Endorsing tremors/spasming in his back over the last several days progressively worsening since shoulder surgery. Denies any headache or neurological deficits as far as he has noted.  Review of Systems  Positive: As above Negative: As above  Physical Exam  BP (!) 146/103 (BP Location: Left Arm)   Pulse 98   Temp 99 F (37.2 C)   Resp 18   Ht 5' 10.5 (1.791 m)   Wt 90.7 kg   SpO2 98%   BMI 28.29 kg/m  Gen:   Awake, no distress   Resp:  Normal effort  MSK:   Moves extremities without difficulty. Tremors present in bilateral legs and neck and somewhat rhythmic in nature. DTRs 2+ in bilateral lower extremities. Other:    Medical Decision Making  Medically screening exam initiated at 6:58 PM.  Appropriate orders placed.  TEJA JUDICE was informed that the remainder of the evaluation will be completed by another provider, this initial triage assessment does not replace that evaluation, and the importance of remaining in the ED until their evaluation is complete.     Kadeem Hyle A, PA-C 09/24/24 1900

## 2024-09-24 NOTE — ED Triage Notes (Signed)
 Patient c/o back pain x 6 months ago. Patient report taking robaxin  and naprosyn without relief. Patient c/o uncontrolled backward jerk movements at triage. Patient stated it got worsen tonight.  Patient report nausea, denies vomiting. Patient denies fever. Patient

## 2024-09-24 NOTE — Discharge Instructions (Addendum)
 I have sent you a prescription for baclofen.  This is a medicine that can help with spasms.  You may take 5 mg 3 times per day.  Stop taking your Robaxin .  I have sent your information to neurology.  They should contact you within 1 week for follow-up appointment.  I have also sent your prescription for metformin.  Take this 2 times per day for your diabetes.  Return to the emergency room if you develop fever, inability to urinate or have a bowel movement, difficulty feeling your legs.

## 2024-09-24 NOTE — ED Provider Notes (Signed)
 [place] EMERGENCY DEPARTMENT AT [facility] Provider Note  CSN: [id number] Arrival date & time: 09/24/24 1751  Chief Complaint(s) Back Pain and Tremors  HPI Andrew Park is a 48 y.o. male who is here today for 6 months of back pain and tremors.  Patient states this has been ongoing since a surgery for his shoulder 6 months ago.  He reports that has been worsening recently.  Patient has been following up on an outpatient basis at [facility].  They have been trying conservative therapy for him.  Partner reports that they were planning to do acupuncture to try to improve his symptoms this past week, but they were unable to make the appointment and had to be rescheduled.  He has not had any fever or chills.  Patient reports that he has been having episodes where his head will jerk like head banging.  He also occasionally have jerking in his legs.  Patient is a diabetic.  He denies fever or chills.  No difficulty with urination or defecation.   Past Medical History Past Medical History:  Diagnosis Date   GERD (gastroesophageal reflux disease)    occ   There are no active problems to display for this patient.  Home Medication(s) Prior to Admission medications   Medication Sig Start Date End Date Taking? Authorizing Provider  Baclofen 5 MG TABS Take 1 tablet (5 mg total) by mouth 3 (three) times daily for 14 days. 09/24/24 10/08/24 Yes Mannie Pac T, DO  metFORMIN (GLUCOPHAGE) 500 MG tablet Take 1 tablet (500 mg total) by mouth 2 (two) times daily with a meal. 09/24/24  Yes Mannie Pac T, DO  acetaminophen  (TYLENOL ) 500 MG tablet Take 2 tablets (1,000 mg total) by mouth every 8 (eight) hours as needed. Patient not taking: Reported on 01/19/2017 09/16/14   Armenta Canning, MD  ibuprofen  (ADVIL ) 600 MG tablet Take 1 tablet (600 mg total) by mouth every 6 (six) hours as needed for mild pain or moderate pain. 01/02/20   Keith Sor, PA-C  lidocaine  (LIDODERM ) 5 % Place 1 patch  onto the skin daily. Remove & Discard patch within 12 hours or as directed by MD 02/03/24   Hildegard Loge, PA-C  ondansetron  (ZOFRAN -ODT) 4 MG disintegrating tablet Take 1 tablet (4 mg total) by mouth every 8 (eight) hours as needed for nausea or vomiting. 07/27/24   Sharl Selinda Dover, MD  oxyCODONE -acetaminophen  (ROXICET) 5-325 MG tablet Take 1-2 tablets by mouth every 4 (four) hours as needed for moderate pain (pain score 4-6) or severe pain (pain score 7-10). 07/27/24   Sharl Selinda Dover, MD  predniSONE (DELTASONE) 10 MG tablet Take 10 mg by mouth daily with breakfast.    [provider]                                                                                                                                    Past Surgical  History Past Surgical History:  Procedure Laterality Date   APPENDECTOMY     BICEPT TENODESIS Left 07/27/2024   Procedure: TENOTOMY BICEPS;  Surgeon: Sharl Selinda Dover, MD;  Location: [facility];  Service: Orthopedics;  Laterality: Left;   INGUINAL HERNIA REPAIR Left 01/21/2017   Procedure: LAPAROSCOPIC LEFT  INGUINAL HERNIA;  Surgeon: Lynda Leos, MD;  Location: [facility];  Service: General;  Laterality: Left;   INSERTION OF MESH Left 01/21/2017   Procedure: INSERTION OF MESH;  Surgeon: Lynda Leos, MD;  Location: [facility];  Service: General;  Laterality: Left;   POSTERIOR LUMBAR FUSION 2 WITH HARDWARE REMOVAL Left 07/27/2024   Procedure: ARTHROSCOPY, SHOULDER WITH DEBRIDEMENT;  Surgeon: Sharl Selinda Dover, MD;  Location: [facility];  Service: Orthopedics;  Laterality: Left;   SUBACROMIAL DECOMPRESSION Left 07/27/2024   Procedure: DECOMPRESSION, SUBACROMIAL SPACE;  Surgeon: Sharl Selinda Dover, MD;  Location: [facility];  Service: Orthopedics;  Laterality: Left;   THYMECTOMY, ROBOT-ASSISTED Left 07/27/2024   Procedure: RELEASE, JOINT CAPSULE, SHOULDER, ARTHROSCOPIC;  Surgeon: Sharl Selinda Dover, MD;  Location:  [facility];  Service: Orthopedics;  Laterality: Left;   TONSILLECTOMY     Family History No family history on file.  Social History Social History   Tobacco Use   Smoking status: Never   Smokeless tobacco: Never  Vaping Use   Vaping status: Never Used  Substance Use Topics   Alcohol use: No   Drug use: No   Allergies Shrimp [shellfish allergy]  Review of Systems Review of Systems  Physical Exam Vital Signs  I have reviewed the triage vital signs BP (!) 146/103 (BP Location: Left Arm)   Pulse 98   Temp 99 F (37.2 C)   Resp 18   Ht 5' 10.5 (1.791 m)   Wt 90.7 kg   SpO2 98%   BMI 28.29 kg/m   Physical Exam Vitals and nursing note reviewed.  Constitutional:      Appearance: He is not toxic-appearing.  HENT:     Head: Normocephalic.  Cardiovascular:     Rate and Rhythm: Normal rate.  Pulmonary:     Effort: Pulmonary effort is normal.  Musculoskeletal:        General: No swelling or deformity. Normal range of motion.  Skin:    General: Skin is warm and dry.  Neurological:     Mental Status: He is alert.     Comments: Patient intermittently slams head backward against the bed.  He also intermittently butterflies his legs open and closed.  He has no numbness or tingling in his bilateral lower extremities.  Intact plantar and dorsi flexion.     ED Results and Treatments Labs (all labs ordered are listed, but only abnormal results are displayed) Labs Reviewed  CBC WITH DIFFERENTIAL/PLATELET - Abnormal; Notable for the following components:      Result Value   WBC 10.8 (*)    All other components within normal limits  COMPREHENSIVE METABOLIC PANEL WITH GFR - Abnormal; Notable for the following components:   Sodium 133 (*)    Chloride 96 (*)    Glucose, Bld 380 (*)    All other components within normal limits  MAGNESIUM  Radiology CT Head Wo Contrast Result Date: 09/24/2024 CLINICAL DATA:  Uncontrollable jerking movements. EXAM: CT HEAD WITHOUT CONTRAST TECHNIQUE: Contiguous axial images were obtained from the base of the skull through the vertex without intravenous contrast. RADIATION DOSE REDUCTION: This exam was performed according to the departmental dose-optimization program which includes automated exposure control, adjustment of the mA and/or kV according to patient size and/or use of iterative reconstruction technique. COMPARISON:  None Available. FINDINGS: Brain: No evidence of acute infarction, hemorrhage, hydrocephalus, extra-axial collection or mass lesion/mass effect. Vascular: No hyperdense vessel or unexpected calcification. Skull: Normal. Negative for fracture or focal lesion. Sinuses/Orbits: No acute finding. Other: None. IMPRESSION: No acute intracranial pathology. Electronically Signed   By: Suzen Dials M.D.   On: 09/24/2024 19:43    Pertinent labs & imaging results that were available during my care of the patient were reviewed by me and considered in my medical decision making (see MDM for details).  Medications Ordered in ED Medications - No data to display                                                                                                                                   Procedures Procedures  (including critical care time)  Medical Decision Making / ED Course   This patient presents to the ED for concern of spasms, this involves an extensive number of treatment options, and is a complaint that carries with it a high risk of complications and morbidity.  Less likely spinal epidural abscess, consider dystonia, consider demyelinating process, consider functional neurological disorder, less likely cord compression, consider somatic disorder, consider underlying motor disorder.  MDM: Patient's symptoms have been ongoing for 6 months.  Given how long the symptoms have  been occurring, do not believe it is emergent to identify cause for his symptoms here in the ED.  I observed the patient having episodes where he jerks his head back, this does not appear to be weakness, is a bit less consistent with tremors.  Possibly dystonia.  I also observe the patient being able to flex and open his legs without difficulty when he has these episodes.  He has no focal weakness or sensory deficits.  Patient is diabetic, his blood sugar is elevated.  He has no fever, no tachycardia.  He does not have a leukocytosis.  I think it is unlikely that this is infection given its duration of symptoms.  I also do not see evidence of cord compression at this time.  I believe the most appropriate path forward for this patient is outpatient follow-up with neurology.  I discussed this with the patient and his partner at bedside.  They were agreeable with this plan.  Will send a prescription for baclofen.  Will have them follow-up with neurology.   Additional history obtained:  -External records from outside source obtained and reviewed including: Chart review including previous notes, labs, imaging, consultation notes  Lab Tests: -I ordered, reviewed, and interpreted labs.   The pertinent results include:   Labs Reviewed  CBC WITH DIFFERENTIAL/PLATELET - Abnormal; Notable for the following components:      Result Value   WBC 10.8 (*)    All other components within normal limits  COMPREHENSIVE METABOLIC PANEL WITH GFR - Abnormal; Notable for the following components:   Sodium 133 (*)    Chloride 96 (*)    Glucose, Bld 380 (*)    All other components within normal limits  MAGNESIUM      Imaging Studies ordered: I ordered imaging studies including CT head I independently visualized and interpreted imaging. I agree with the radiologist interpretation   Medicines ordered and prescription drug management: Meds ordered this encounter  Medications   Baclofen 5 MG TABS    Sig:  Take 1 tablet (5 mg total) by mouth 3 (three) times daily for 14 days.    Dispense:  42 tablet    Refill:  0   metFORMIN (GLUCOPHAGE) 500 MG tablet    Sig: Take 1 tablet (500 mg total) by mouth 2 (two) times daily with a meal.    Dispense:  60 tablet    Refill:  0    -I have reviewed the patients home medicines and have made adjustments as needed   Cardiac Monitoring: The patient was maintained on a cardiac monitor.  I personally viewed and interpreted the cardiac monitored which showed an underlying rhythm of: Normal sinus rhythm  Social Determinants of Health:  Factors impacting patients care include: Lack of access to primary care   Reevaluation: After the interventions noted above, I reevaluated the patient and found that they have :improved  Co morbidities that complicate the patient evaluation  Past Medical History:  Diagnosis Date   GERD (gastroesophageal reflux disease)    occ      Dispostion: I considered admission for this patient, however I believe he is appropriate for outpatient workup.     Final Clinical Impression(s) / ED Diagnoses Final diagnoses:  Occasional tremors     @PCDICTATION @    Mannie Pac T, DO 09/24/24 2204

## 2024-12-18 ENCOUNTER — Encounter (HOSPITAL_BASED_OUTPATIENT_CLINIC_OR_DEPARTMENT_OTHER): Payer: Self-pay | Admitting: Orthopedic Surgery

## 2025-01-01 ENCOUNTER — Encounter (HOSPITAL_BASED_OUTPATIENT_CLINIC_OR_DEPARTMENT_OTHER): Payer: Self-pay | Admitting: Orthopedic Surgery

## 2025-01-09 ENCOUNTER — Ambulatory Visit: Admitting: Diagnostic Neuroimaging

## 2025-01-09 ENCOUNTER — Encounter: Payer: Self-pay | Admitting: Diagnostic Neuroimaging

## 2025-01-09 VITALS — BP 128/88 | HR 87 | Ht 70.5 in | Wt 206.4 lb

## 2025-01-09 DIAGNOSIS — M62838 Other muscle spasm: Secondary | ICD-10-CM | POA: Diagnosis not present

## 2025-01-09 DIAGNOSIS — R251 Tremor, unspecified: Secondary | ICD-10-CM

## 2025-01-09 DIAGNOSIS — F0781 Postconcussional syndrome: Secondary | ICD-10-CM | POA: Diagnosis not present

## 2025-01-09 MED ORDER — DULOXETINE HCL 30 MG PO CPEP: 30.0000 mg | ORAL_CAPSULE | Freq: Every day | ORAL | 6 refills | Status: AC

## 2025-01-09 NOTE — Patient Instructions (Signed)
" °  POST-TRAUMATIC PAIN, SPASM, TREMORS; POST-CONCUSSION SYNDROME (stress reaction) - check MRI brain, cervical spine - start duloxetine  30mg  daily - refer to pain mgmt clinic "
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
## 2025-01-17 ENCOUNTER — Telehealth: Payer: Self-pay | Admitting: Diagnostic Neuroimaging

## 2025-01-17 NOTE — Telephone Encounter (Signed)
 MRI brain approved: auth: [id number] exp. 01/14/2025-02/13/2025 scheduled at GI on 01/27/25.  MRI cervical spine is pending wanting: notes from your doctor that show you completed a neck exercise plan (physical therapy, a medically directed home exercise program, or chiropractic treatment) for at least six weeks or more and did not get better. These notes should say that you did those exercises in the last six months. We need details of the dates and how often you did them. Your doctor could also send your therapy notes. If you did exercises at home, we also need to know what exercises you did.

## 2025-01-27 ENCOUNTER — Other Ambulatory Visit

## 2025-02-06 ENCOUNTER — Other Ambulatory Visit

## 2025-02-07 ENCOUNTER — Other Ambulatory Visit
